# Patient Record
Sex: Male | Born: 1950
Health system: Southern US, Community
[De-identification: ages and names within clinical notes are randomized; demographics above are authoritative.]

## PROBLEM LIST (undated history)

## (undated) DIAGNOSIS — M2550 Pain in unspecified joint: Secondary | ICD-10-CM

## (undated) DIAGNOSIS — C801 Malignant (primary) neoplasm, unspecified: Secondary | ICD-10-CM

## (undated) DIAGNOSIS — E785 Hyperlipidemia, unspecified: Secondary | ICD-10-CM

## (undated) DIAGNOSIS — K5792 Diverticulitis of intestine, part unspecified, without perforation or abscess without bleeding: Secondary | ICD-10-CM

## (undated) DIAGNOSIS — I809 Phlebitis and thrombophlebitis of unspecified site: Secondary | ICD-10-CM

## (undated) DIAGNOSIS — K219 Gastro-esophageal reflux disease without esophagitis: Secondary | ICD-10-CM

## (undated) DIAGNOSIS — R112 Nausea with vomiting, unspecified: Secondary | ICD-10-CM

## (undated) DIAGNOSIS — K649 Unspecified hemorrhoids: Secondary | ICD-10-CM

## (undated) DIAGNOSIS — M1990 Unspecified osteoarthritis, unspecified site: Secondary | ICD-10-CM

## (undated) DIAGNOSIS — Z9889 Other specified postprocedural states: Secondary | ICD-10-CM

## (undated) DIAGNOSIS — M549 Dorsalgia, unspecified: Secondary | ICD-10-CM

## (undated) DIAGNOSIS — H9190 Unspecified hearing loss, unspecified ear: Secondary | ICD-10-CM

## (undated) DIAGNOSIS — N200 Calculus of kidney: Secondary | ICD-10-CM

## (undated) DIAGNOSIS — R972 Elevated prostate specific antigen [PSA]: Secondary | ICD-10-CM

## (undated) HISTORY — DX: Hyperlipidemia, unspecified: E78.5

## (undated) HISTORY — DX: Dorsalgia, unspecified: M54.9

## (undated) HISTORY — DX: Diverticulitis of intestine, part unspecified, without perforation or abscess without bleeding: K57.92

## (undated) HISTORY — DX: Gastro-esophageal reflux disease without esophagitis: K21.9

## (undated) HISTORY — DX: Elevated prostate specific antigen (PSA): R97.20

## (undated) HISTORY — PX: OTHER SURGICAL HISTORY: SHX169

## (undated) HISTORY — DX: Unspecified hemorrhoids: K64.9

## (undated) HISTORY — DX: Calculus of kidney: N20.0

---

## 2013-12-31 HISTORY — PX: BACK SURGERY: SHX140

## 2016-07-30 ENCOUNTER — Encounter: Payer: Self-pay | Admitting: Internal Medicine

## 2016-08-13 ENCOUNTER — Encounter: Payer: Self-pay | Admitting: Nurse Practitioner

## 2016-08-13 ENCOUNTER — Ambulatory Visit (INDEPENDENT_AMBULATORY_CARE_PROVIDER_SITE_OTHER): Payer: BLUE CROSS/BLUE SHIELD | Admitting: Nurse Practitioner

## 2016-08-13 DIAGNOSIS — K625 Hemorrhage of anus and rectum: Secondary | ICD-10-CM

## 2016-08-13 DIAGNOSIS — R197 Diarrhea, unspecified: Secondary | ICD-10-CM

## 2016-08-13 NOTE — Progress Notes (Signed)
cc'ed to pcp °

## 2016-08-13 NOTE — Patient Instructions (Signed)
1. Good luck with her prostate biopsy. 2. We will request your previous colonoscopy records from [facility]. 3. Call us when you're ready to move forward with the colonoscopy. 4. Return for follow-up in 8 weeks to check on your diarrhea.

## 2016-08-13 NOTE — Progress Notes (Signed)
Primary Care Physician:  Glenda Chroman, MD Primary Gastroenterologist:  Dr. Gala Romney  Chief Complaint  Patient presents with  . Blood In Stools    x6 weeks, improving. Started after being on Cipro for prostate  . Hemorrhoids  . Diarrhea    HPI:   Albert Graham is a 65 y.o. male who presents In referral from primary care for rectal bleeding. PCP notes reviewed. The patient last saw primary care on 07/23/2016 for recheck of diarrhea on antibiotics. At that time stated loose stools for 2 weeks prior to last PCP visit. Stool study for C. difficile was ordered, referral to GI for diarrhea and blood in stool. Prostate biopsy is planned by [facility]. Per PCP notes, last colonoscopy was in 2007. No record in our system of colonoscopy or endoscopy.  Today he states he's doing well overall. On 10/17 he went for his physical and immunizations, PSA was high and was placed on antibiotics which didn't impove PSA. Had new onset of diarrhea and gas with antibiotics. PCP switched his antibiotic. PSA further increased and is seeing urology. Was also recommended to take probiotics, Immodium, Pepto, and Yogurt; no improvement in diarrhea. States CDiff was negative. Began having rectal bleeding with diarrhea. Has a history of bleeding hemorrhoids. Blood is on the stool and toilet tissue. Was having hemorrhoids symptoms and protrusion about 3 weeks ago when improved after a few days. Denies abdominal pain, N/V, unintentional weight loss, melena. Denies chest pain, dyspnea, dizziness, lightheadedness, syncope, near syncope. Denies any other upper or lower GI symptoms.  Completed antibiotics about 1 week ago. States he had a bowel movement before his OV today which "was the first one in a while that was more normal."   Last colonoscopy 2007, done at [facility] in [place], [place]. History of benign polyps. Last colonoscopy was clear.   Past Medical History:  Diagnosis Date  . Back pain   . Dyslipidemia   . Hemorrhoids     . Kidney stone     No past surgical history on file.  Current Outpatient Prescriptions  Medication Sig Dispense Refill  . aspirin EC 81 MG tablet Take 81 mg by mouth 2 (two) times daily.    Marland Kitchen atorvastatin (LIPITOR) 10 MG tablet Take 10 mg by mouth daily.  0  . Co-Enzyme Q-10 100 MG CAPS Take 1 capsule by mouth daily.    . diclofenac sodium (VOLTAREN) 1 % GEL Apply topically as needed.    . Multiple Vitamin (MULTIVITAMIN) tablet Take 1 tablet by mouth daily.     No current facility-administered medications for this visit.     Allergies as of 08/13/2016 - Review Complete 08/13/2016  Allergen Reaction Noted  . Codeine Nausea And Vomiting 08/13/2016    Family History  Problem Relation Age of Onset  . Stomach cancer Father   . Colon cancer Neg Hx     Social History   Social History  . Marital status: Married    Spouse name: N/A  . Number of children: N/A  . Years of education: N/A   Occupational History  . Not on file.   Social History Main Topics  . Smoking status: Never Smoker  . Smokeless tobacco: Never Used  . Alcohol use No  . Drug use: No  . Sexual activity: Not on file   Other Topics Concern  . Not on file   Social History Narrative  . No narrative on file    Review of Systems: General: Negative for  anorexia, weight loss, fever, chills, fatigue, weakness. ENT: Negative for hoarseness, difficulty swallowing. CV: Negative for chest pain, angina, palpitations, peripheral edema.  Respiratory: Negative for dyspnea at rest, cough, sputum, wheezing.  GI: See history of present illness. MS: Negative for joint pain, low back pain.  Derm: Negative for rash or itching.  Endo: Negative for unusual weight change.  Heme: Negative for bruising or bleeding. Allergy: Negative for rash or hives.    Physical Exam: BP (!) 147/88   Pulse (!) 104   Temp 98 F (36.7 C) (Oral)   Ht 5\' 7"  (1.702 m)   Wt 186 lb (84.4 kg)   BMI 29.13 kg/m  General:   Alert and  oriented. Pleasant and cooperative. Well-nourished and well-developed.  Head:  Normocephalic and atraumatic. Eyes:  Without icterus, sclera clear and conjunctiva pink.  Ears:  Normal auditory acuity. Cardiovascular:  S1, S2 present without murmurs appreciated. Extremities without clubbing or edema. Respiratory:  Clear to auscultation bilaterally. No wheezes, rales, or rhonchi. No distress.  Gastrointestinal:  +BS, soft, non-tender and non-distended. No HSM noted. No guarding or rebound. No masses appreciated.  Rectal:  Deferred  Musculoskalatal:  Symmetrical without gross deformities. Neurologic:  Alert and oriented x4;  grossly normal neurologically. Psych:  Alert and cooperative. Normal mood and affect. Heme/Lymph/Immune: No excessive bruising noted.    08/13/2016 12:48 PM   Disclaimer: This note was dictated with voice recognition software. Similar sounding words can inadvertently be transcribed and may not be corrected upon review.

## 2016-08-13 NOTE — Assessment & Plan Note (Signed)
Patient has had new onset diarrhea since starting antibiotics. Is taking probiotic currently. Had a nearly formed stool today, has been off abx for about 1 week. CDiff negative. Noted rectal bleeding in the setting of known hemorrhoids as noted above. Likely antibiotic associated diarrhea, No GI path panel today due to stool now becoming formed. Retrn for follow-up in 8 weeks. Call if worsening/recurrent symptoms. He will notify us about wishes for repeat colonoscopy timeframe based on the results of his prostate biopsy.

## 2016-08-13 NOTE — Assessment & Plan Note (Signed)
The patient describes rectal bleeding, though not severe, in the setting of known hemorrhoids and diarrhea due to antibiotic use. His last colonoscopy was in 2007, we have requested records. At this point he is due for colonoscopy. However, he has a biopsy of his prostate coming up due to accelerating PSA. That is scheduled for Wednesday. He would like to hold off to see if he needs to undergo treatment for his prostate and will get back to [place] about scheduling colonoscopy.

## 2016-09-18 ENCOUNTER — Other Ambulatory Visit: Payer: Self-pay | Admitting: Urology

## 2016-10-12 ENCOUNTER — Other Ambulatory Visit (HOSPITAL_COMMUNITY): Payer: BLUE CROSS/BLUE SHIELD

## 2016-10-15 ENCOUNTER — Ambulatory Visit: Payer: BLUE CROSS/BLUE SHIELD | Admitting: Nurse Practitioner

## 2016-10-15 ENCOUNTER — Other Ambulatory Visit (HOSPITAL_COMMUNITY): Payer: Self-pay | Admitting: *Deleted

## 2016-10-15 NOTE — Patient Instructions (Signed)
Albert Graham  10/15/2016   Your procedure is scheduled on: 10-18-16  Report to [facility]  Entrance take [facility]  elevators to 3rd floor to  [place] at 515  AM.  Call this number if you have problems the morning of surgery [phone]   Remember: ONLY 1 PERSON MAY GO WITH YOU TO SHORT STAY TO GET  READY MORNING OF [place].  Do not eat food:After Midnight Tuesday NIGHT CLEAR LIQUIDS ALL DAY Wednesday 10-17-16 PER DR BORDEN, FOLLOW ALL BOWEL PEP INSTRUCTIONS FROM DR Alinda Money. NOTHING BY MOUTH AFTER MIDNIGHT Wednesday NIGHT. .     Take these medicines the morning of surgery with A SIP OF WATER: NONE                               You may not have any metal on your body including hair pins and              piercings  Do not wear jewelry, make-up, lotions, powders or perfumes, deodorant             Do not wear nail polish.  Do not shave  48 hours prior to surgery.              Men may shave face and neck.   Do not bring valuables to the hospital. [place].  Contacts, dentures or bridgework may not be worn into surgery.  Leave suitcase in the car. After surgery it may be brought to your room.                  Please read over the following fact sheets you were given: _____________________________________________________________________                CLEAR LIQUID DIET   Foods Allowed                                                                     Foods Excluded  Coffee and tea, regular and decaf                             liquids that you cannot  Plain Jell-O in any flavor                                             see through such as: Fruit ices (not with fruit pulp)                                     milk, soups, orange juice  Iced Popsicles  All solid food Carbonated beverages, regular and diet                                    Cranberry, grape and  apple juices Sports drinks like Gatorade Lightly seasoned clear broth or consume(fat free) Sugar, honey syrup  Sample Menu Breakfast                                Lunch                                     Supper Cranberry juice                    Beef broth                            Chicken broth Jell-O                                     Grape juice                           Apple juice Coffee or tea                        Jell-O                                      Popsicle                                                Coffee or tea                        Coffee or tea  _____________________________________________________________________  [facility] - Preparing for Surgery Before surgery, you can play an important role.  Because skin is not sterile, your skin needs to be as free of germs as possible.  You can reduce the number of germs on your skin by washing with CHG (chlorahexidine gluconate) soap before surgery.  CHG is an antiseptic cleaner which kills germs and bonds with the skin to continue killing germs even after washing. Please DO NOT use if you have an allergy to CHG or antibacterial soaps.  If your skin becomes reddened/irritated stop using the CHG and inform your nurse when you arrive at Short Stay. Do not shave (including legs and underarms) for at least 48 hours prior to the first CHG shower.  You may shave your face/neck. Please follow these instructions carefully:  1.  Shower with CHG Soap the night before surgery and the  morning of Surgery.  2.  If you choose to wash your hair, wash your hair first as usual with your  normal  shampoo.  3.  After you shampoo, rinse your hair and body thoroughly to remove the  shampoo.  4.  Use CHG as you would any other liquid soap.  You can apply chg directly  to the skin and wash                       Gently with a scrungie or clean washcloth.  5.  Apply the CHG Soap to your body ONLY FROM THE NECK DOWN.   Do  not use on face/ open                           Wound or open sores. Avoid contact with eyes, ears mouth and genitals (private parts).                       Wash face,  Genitals (private parts) with your normal soap.             6.  Wash thoroughly, paying special attention to the area where your surgery  will be performed.  7.  Thoroughly rinse your body with warm water from the neck down.  8.  DO NOT shower/wash with your normal soap after using and rinsing off  the CHG Soap.                9.  Pat yourself dry with a clean towel.            10.  Wear clean pajamas.            11.  Place clean sheets on your bed the night of your first shower and do not  sleep with pets. Day of Surgery : Do not apply any lotions/deodorants the morning of surgery.  Please wear clean clothes to the hospital/surgery center.  FAILURE TO FOLLOW THESE INSTRUCTIONS MAY RESULT IN THE CANCELLATION OF YOUR SURGERY PATIENT SIGNATURE_________________________________  NURSE SIGNATURE__________________________________  ________________________________________________________________________   Adam Phenix  An incentive spirometer is a tool that can help keep your lungs clear and active. This tool measures how well you are filling your lungs with each breath. Taking long deep breaths may help reverse or decrease the chance of developing breathing (pulmonary) problems (especially infection) following:  A long period of time when you are unable to move or be active. BEFORE THE PROCEDURE   If the spirometer includes an indicator to show your best effort, your nurse or respiratory therapist will set it to a desired goal.  If possible, sit up straight or lean slightly forward. Try not to slouch.  Hold the incentive spirometer in an upright position. INSTRUCTIONS FOR USE  1. Sit on the edge of your bed if possible, or sit up as far as you can in bed or on a chair. 2. Hold the incentive spirometer in an upright  position. 3. Breathe out normally. 4. Place the mouthpiece in your mouth and seal your lips tightly around it. 5. Breathe in slowly and as deeply as possible, raising the piston or the ball toward the top of the column. 6. Hold your breath for 3-5 seconds or for as long as possible. Allow the piston or ball to fall to the bottom of the column. 7. Remove the mouthpiece from your mouth and breathe out normally. 8. Rest for a few seconds and repeat Steps 1 through 7 at least 10 times every 1-2 hours when you are awake. Take your time and take a few normal breaths between deep breaths. 9. The spirometer may include an indicator to  show your best effort. Use the indicator as a goal to work toward during each repetition. 10. After each set of 10 deep breaths, practice coughing to be sure your lungs are clear. If you have an incision (the cut made at the time of surgery), support your incision when coughing by placing a pillow or rolled up towels firmly against it. Once you are able to get out of bed, walk around indoors and cough well. You may stop using the incentive spirometer when instructed by your caregiver.  RISKS AND COMPLICATIONS  Take your time so you do not get dizzy or light-headed.  If you are in pain, you may need to take or ask for pain medication before doing incentive spirometry. It is harder to take a deep breath if you are having pain. AFTER USE  Rest and breathe slowly and easily.  It can be helpful to keep track of a log of your progress. Your caregiver can provide you with a simple table to help with this. If you are using the spirometer at home, follow these instructions: [place] IF:   You are having difficultly using the spirometer.  You have trouble using the spirometer as often as instructed.  Your pain medication is not giving enough relief while using the spirometer.  You develop fever of 100.5 F (38.1 C) or higher. SEEK IMMEDIATE MEDICAL CARE IF:    You cough up bloody sputum that had not been present before.  You develop fever of 102 F (38.9 C) or greater.  You develop worsening pain at or near the incision site. MAKE SURE YOU:   Understand these instructions.  Will watch your condition.  Will get help right away if you are not doing well or get worse. Document Released: 01/07/2007 Document Revised: 11/19/2011 Document Reviewed: 03/10/2007 ExitCare Patient Information 2014 [employer], [place].   ________________________________________________________________________  WHAT IS A BLOOD TRANSFUSION? Blood Transfusion Information  A transfusion is the replacement of blood or some of its parts. Blood is made up of multiple cells which provide different functions.  Red blood cells carry oxygen and are used for blood loss replacement.  White blood cells fight against infection.  Platelets control bleeding.  Plasma helps clot blood.  Other blood products are available for specialized needs, such as hemophilia or other clotting disorders. BEFORE THE TRANSFUSION  Who gives blood for transfusions?   Healthy volunteers who are fully evaluated to make sure their blood is safe. This is blood bank blood. Transfusion therapy is the safest it has ever been in the practice of medicine. Before blood is taken from a donor, a complete history is taken to make sure that person has no history of diseases nor engages in risky social behavior (examples are intravenous drug use or sexual activity with multiple partners). The donor's travel history is screened to minimize risk of transmitting infections, such as malaria. The donated blood is tested for signs of infectious diseases, such as HIV and hepatitis. The blood is then tested to be sure it is compatible with you in order to minimize the chance of a transfusion reaction. If you or a relative donates blood, this is often done in anticipation of surgery and is not appropriate for emergency  situations. It takes many days to process the donated blood. RISKS AND COMPLICATIONS Although transfusion therapy is very safe and saves many lives, the main dangers of transfusion include:   Getting an infectious disease.  Developing a transfusion reaction. This is an allergic reaction  to something in the blood you were given. Every precaution is taken to prevent this. The decision to have a blood transfusion has been considered carefully by your caregiver before blood is given. Blood is not given unless the benefits outweigh the risks. AFTER THE TRANSFUSION  Right after receiving a blood transfusion, you will usually feel much better and more energetic. This is especially true if your red blood cells have gotten low (anemic). The transfusion raises the level of the red blood cells which carry oxygen, and this usually causes an energy increase.  The nurse administering the transfusion will monitor you carefully for complications. HOME CARE INSTRUCTIONS  No special instructions are needed after a transfusion. You may find your energy is better. Speak with your caregiver about any limitations on activity for underlying diseases you may have. SEEK MEDICAL CARE IF:   Your condition is not improving after your transfusion.  You develop redness or irritation at the intravenous (IV) site. SEEK IMMEDIATE MEDICAL CARE IF:  Any of the following symptoms occur over the next 12 hours:  Shaking chills.  You have a temperature by mouth above 102 F (38.9 C), not controlled by medicine.  Chest, back, or muscle pain.  People around you feel you are not acting correctly or are confused.  Shortness of breath or difficulty breathing.  Dizziness and fainting.  You get a rash or develop hives.  You have a decrease in urine output.  Your urine turns a dark color or changes to pink, red, or brown. Any of the following symptoms occur over the next 10 days:  You have a temperature by mouth above  102 F (38.9 C), not controlled by medicine.  Shortness of breath.  Weakness after normal activity.  The white part of the eye turns yellow (jaundice).  You have a decrease in the amount of urine or are urinating less often.  Your urine turns a dark color or changes to pink, red, or brown. Document Released: 08/24/2000 Document Revised: 11/19/2011 Document Reviewed: 04/12/2008 [facility] Patient Information 2014 [place], [place].  _______________________________________________________________________

## 2016-10-16 ENCOUNTER — Encounter (HOSPITAL_COMMUNITY)
Admission: RE | Admit: 2016-10-16 | Discharge: 2016-10-16 | Disposition: A | Discharge status: Home / Self Care | Payer: BLUE CROSS/BLUE SHIELD | Source: Ambulatory Visit | Attending: Urology | Admitting: Urology

## 2016-10-16 ENCOUNTER — Encounter (INDEPENDENT_AMBULATORY_CARE_PROVIDER_SITE_OTHER): Payer: Self-pay

## 2016-10-16 ENCOUNTER — Encounter (HOSPITAL_COMMUNITY): Payer: Self-pay

## 2016-10-16 ENCOUNTER — Ambulatory Visit (HOSPITAL_COMMUNITY)
Admission: RE | Admit: 2016-10-16 | Discharge: 2016-10-16 | Disposition: A | Discharge status: Home / Self Care | Payer: BLUE CROSS/BLUE SHIELD | Source: Ambulatory Visit | Attending: Urology | Admitting: Urology

## 2016-10-16 DIAGNOSIS — Z01818 Encounter for other preprocedural examination: Secondary | ICD-10-CM

## 2016-10-16 DIAGNOSIS — Z01810 Encounter for preprocedural cardiovascular examination: Secondary | ICD-10-CM | POA: Diagnosis not present

## 2016-10-16 DIAGNOSIS — C61 Malignant neoplasm of prostate: Secondary | ICD-10-CM | POA: Insufficient documentation

## 2016-10-16 DIAGNOSIS — Z01812 Encounter for preprocedural laboratory examination: Secondary | ICD-10-CM | POA: Diagnosis not present

## 2016-10-16 HISTORY — DX: Unspecified hearing loss, unspecified ear: H91.90

## 2016-10-16 HISTORY — DX: Phlebitis and thrombophlebitis of unspecified site: I80.9

## 2016-10-16 HISTORY — DX: Unspecified osteoarthritis, unspecified site: M19.90

## 2016-10-16 HISTORY — DX: Pain in unspecified joint: M25.50

## 2016-10-16 HISTORY — DX: Nausea with vomiting, unspecified: R11.2

## 2016-10-16 HISTORY — DX: Malignant (primary) neoplasm, unspecified: C80.1

## 2016-10-16 HISTORY — DX: Other specified postprocedural states: Z98.890

## 2016-10-16 LAB — BASIC METABOLIC PANEL
ANION GAP: 9 (ref 5–15)
BUN: 14 mg/dL (ref 6–20)
CHLORIDE: 104 mmol/L (ref 101–111)
CO2: 25 mmol/L (ref 22–32)
Calcium: 9.6 mg/dL (ref 8.9–10.3)
Creatinine, Ser: 0.95 mg/dL (ref 0.61–1.24)
GFR calc Af Amer: >60 mL/min (ref >60)
GLUCOSE: 109 mg/dL — AB (ref 65–99)
Potassium: 3.8 mmol/L (ref 3.5–5.1)
Sodium: 138 mmol/L (ref 135–145)

## 2016-10-16 LAB — CBC
HEMATOCRIT: 46.1 % (ref 39.0–52.0)
HEMOGLOBIN: 15.8 g/dL (ref 13.0–17.0)
MCH: 29.1 pg (ref 26.0–34.0)
MCHC: 34.3 g/dL (ref 30.0–36.0)
MCV: 84.9 fL (ref 78.0–100.0)
Platelets: 201 10*3/uL (ref 150–400)
RBC: 5.43 MIL/uL (ref 4.22–5.81)
RDW: 13.1 % (ref 11.5–15.5)
WBC: 8.1 10*3/uL (ref 4.0–10.5)

## 2016-10-16 LAB — ABO/RH: ABO/RH(D): A POS

## 2016-10-17 NOTE — H&P (Signed)
Office Visit Report     10/12/2016   --------------------------------------------------------------------------------   Albert Graham  MRN: [id number]  PRIMARY CARE:  Glenda Chroman, MD  DOB: [birth year], 66 year old Male  REFERRING:  Glenda Chroman, MD  SSN: [id number]  PROVIDER:  Irine Seal, M.D.    TREATING:  Raynelle Bring, M.D.    LOCATION:  [facility] [phone]   --------------------------------------------------------------------------------   CC/HPI: CC: Prostate Cancer   Physician requesting consult: Dr. Irine Seal  PCP: Dr. Woody Seller   Albert Graham is a 66 year old gentleman noted to have a PSA of 5.9 and right base prostate nodule that prompted a TRUS biopsy of the prostate by Dr. Jeffie Pollock on 08/15/16. This confirmed Gleason 3+4=7 adenocarcinoma with 6 out of 12 biopsy cores positive for malignancy.   Family history: None.   Imaging studies: None.   PMH: He has a history of hypercholesterolemia. He also has a remote history of a DVT (lower extremity and another upper extremity episode) and remains on aspirin 162 mg for this reason.  PSH: No abdominal surgeries.   TNM stage: cT2a Nx Mx (right base nodule)  PSA: 5.9  Gleason score: 3+4=7  Biopsy (08/15/16): 6/12 cores positive  Left: L lateral apex (20%, 3+3=6), L apex (20%, 3+3=6), L lateral mid (30%, 3+3=6), L mid (10%, 3+3=6)  Right: R base (10%, 3+3=6), R lateral base (70%, 3+4=7, PNI)  Prostate volume: 54 cc   Nomogram  OC disease: 36%  EPE: 63%  SVI: 6%  LNI: 6%  PFS (5 year, 10 year): 82%,70%   Urinary function: IPSS is 0.  Erectile function: SHIM score is 5. He maintains excellent function but is not sexually active. His wife did have vaginal prolapse surgery and is no longer able to have intercourse without discomfort. This is a low priority to him and his wife.     ALLERGIES: Codeine    MEDICATIONS: Aspirin 81 mg tablet, chewable  Atorvastatin Calcium  Coq10  Diclofenac Sodium 75 mg tablet,  delayed release  Multivitamin  Tramadol Hcl     GU PSH: Prostate Needle Biopsy - 08/15/2016    NON-GU PSH: Lumbar Laminectomy Surgical Pathology, Gross And Microscopic Examination For Prostate Needle - 08/15/2016    GU PMH: Prostate Cancer, He has T2a Nx Mx Gleason 7(3+4) prostate cancer with minimal LUTs and preserve erections. After a review of the options he would like to proceed with surgical therapy with Dr. Alinda Money so I will arrange a consultation. - 09/12/2016 Nodular prostate w/o LUTS - 07/17/2016 Kidney Stone    NON-GU PMH: Muscle weakness (generalized) - 10/03/2016, - 09/25/2016 Arthritis DVT, History Hypercholesterolemia    FAMILY HISTORY: 1 son - Other Cancer - Mother, Father   SOCIAL HISTORY: Marital Status: Married Current Smoking Status: Patient has never smoked.  Does not drink caffeine.     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    REVIEW OF SYSTEMS:    GU Review Male:   Patient denies frequent urination, hard to postpone urination, burning/ pain with urination, get up at night to urinate, leakage of urine, stream starts and stops, trouble starting your streams, and have to strain to urinate .  Gastrointestinal (Upper):   Patient denies nausea and vomiting.  Gastrointestinal (Lower):   Patient denies diarrhea and constipation.  Constitutional:   Patient denies fever, night sweats, weight loss, and fatigue.  Skin:   Patient denies skin rash/ lesion and itching.  Eyes:  Patient denies blurred vision and double vision.  Ears/ Nose/ Throat:   Patient denies sore throat and sinus problems.  Hematologic/Lymphatic:   Patient denies swollen glands and easy bruising.  Cardiovascular:   Patient denies leg swelling and chest pains.  Respiratory:   Patient denies cough and shortness of breath.  Endocrine:   Patient denies excessive thirst.  Musculoskeletal:   Patient denies back pain and joint pain.  Neurological:   Patient denies headaches and  dizziness.  Psychologic:   Patient denies depression and anxiety.   VITAL SIGNS:      10/12/2016 11:45 AM  Weight 192 lb / 87.09 kg  Height 67 in / 170.18 cm  BP 119/81 mmHg  Pulse 92 /min  BMI 30.1 kg/m   MULTI-SYSTEM PHYSICAL EXAMINATION:    Constitutional: Well-nourished. No physical deformities. Normally developed. Good grooming.  Neck: Neck symmetrical, not swollen. Normal tracheal position.  Respiratory: No labored breathing, no use of accessory muscles. Clear bilaterally.  Cardiovascular: Normal temperature, normal extremity pulses, no swelling, no varicosities. RRR.  Lymphatic: No enlargement of neck, axillae, groin.  Skin: No paleness, no jaundice, no cyanosis. No lesion, no ulcer, no rash.  Neurologic / Psychiatric: Oriented to time, oriented to place, oriented to person. No depression, no anxiety, no agitation.  Gastrointestinal: No mass, no tenderness, no rigidity, non obese abdomen.  Eyes: Normal conjunctivae. Normal eyelids.  Ears, Nose, Mouth, and Throat: Left ear no scars, no lesions, no masses. Right ear no scars, no lesions, no masses. Nose no scars, no lesions, no masses. Normal hearing. Normal lips.  Musculoskeletal: Normal gait and station of head and neck.     PAST DATA REVIEWED:  Source Of History:  Patient  Lab Test Review:   PSA  Records Review:   Pathology Reports, Previous Patient Records  Urine Test Review:   Urinalysis   07/13/16 06/27/16 06/09/15  PSA  Total PSA 5.9 ng/dl 4.9 ng/dl 3.8 ng/dl  % Free PSA 15.1 %      PROCEDURES:          Urinalysis Dipstick Dipstick Cont'd  Color: Yellow Bilirubin: Neg  Appearance: Clear Ketones: Neg  Specific Gravity: 1.015 Blood: Neg  pH: 7.0 Protein: Neg  Glucose: Neg Urobilinogen: 0.2    Nitrites: Neg    Leukocyte Esterase: Neg    ASSESSMENT:      ICD-10 Details  1 GU:   Prostate Cancer - C61    PLAN:           Schedule Return Visit/Planned Activity: Keep Scheduled Appointment           Document Letter(s):  Created for Patient: Clinical Summary         Notes:   1. Prostate cancer: I had a detailed discussion with Albert Graham and his wife today. He has elected to proceed with surgical treatment of his prostate cancer.   The patient was counseled about the natural history of prostate cancer and the standard treatment options that are available for prostate cancer. It was explained to him how his age and life expectancy, clinical stage, Gleason score, and PSA affect his prognosis, the decision to proceed with additional staging studies, as well as how that information influences recommended treatment strategies. We discussed the roles for active surveillance, radiation therapy, surgical therapy, androgen deprivation, as well as ablative therapy options for the treatment of prostate cancer as appropriate to his individual cancer situation. We discussed the risks and benefits of these options with regard to their impact  on cancer control and also in terms of potential adverse events, complications, and impact on quality of life particularly related to urinary and sexual function. The patient was encouraged to ask questions throughout the discussion today and all questions were answered to his stated satisfaction. In addition, the patient was provided with and/or directed to appropriate resources and literature for further education about prostate cancer and treatment options.   We discussed surgical therapy for prostate cancer including the different available surgical approaches. We discussed, in detail, the risks and expectations of surgery with regard to cancer control, urinary control, and erectile function as well as the expected postoperative recovery process. Additional risks of surgery including but not limited to bleeding, infection, hernia formation, nerve damage, lymphocele formation, bowel/rectal injury potentially necessitating colostomy, damage to the urinary tract resulting in urine  leakage, urethral stricture, and the cardiopulmonary risks such as myocardial infarction, stroke, death, venothromboembolism, etc. were explained. The risk of open surgical conversion for robotic/laparoscopic prostatectomy was also discussed.   He is scheduled undergo a bilateral nerve sparing robot-assisted laparoscopic radical prostatectomy and pelvic lymphadenectomy next week.   I will plan to give him 5000 units of subcutaneous heparin preoperatively and perioperatively. He also has been instructed that he can remain on aspirin 81 mg perioperatively.   Cc: Dr. Irine Seal  Dr. Vonita Moss & M CODE: I spent at least 60 minutes face to face with the patient, more than 50% of that time was spent on counseling and/or coordinating care.     * Signed by Raynelle Bring, M.D. on 10/13/16 at 7:53 AM (EST)*

## 2016-10-18 ENCOUNTER — Encounter (HOSPITAL_COMMUNITY): Payer: Self-pay | Admitting: *Deleted

## 2016-10-18 ENCOUNTER — Inpatient Hospital Stay (HOSPITAL_COMMUNITY): Payer: BLUE CROSS/BLUE SHIELD | Admitting: Certified Registered Nurse Anesthetist

## 2016-10-18 ENCOUNTER — Observation Stay (HOSPITAL_COMMUNITY)
Admission: RE | Admit: 2016-10-18 | Discharge: 2016-10-19 | DRG: 708 | Disposition: A | Discharge status: Home / Self Care | Payer: BLUE CROSS/BLUE SHIELD | Source: Ambulatory Visit | Attending: Urology | Admitting: Urology

## 2016-10-18 ENCOUNTER — Encounter (HOSPITAL_COMMUNITY)
Admission: RE | Disposition: A | Discharge status: Home / Self Care | Payer: Self-pay | Source: Ambulatory Visit | Attending: Urology

## 2016-10-18 DIAGNOSIS — E7800 Pure hypercholesterolemia, unspecified: Secondary | ICD-10-CM | POA: Diagnosis not present

## 2016-10-18 DIAGNOSIS — Z86718 Personal history of other venous thrombosis and embolism: Secondary | ICD-10-CM | POA: Diagnosis not present

## 2016-10-18 DIAGNOSIS — C61 Malignant neoplasm of prostate: Secondary | ICD-10-CM | POA: Diagnosis present

## 2016-10-18 DIAGNOSIS — Z79899 Other long term (current) drug therapy: Secondary | ICD-10-CM | POA: Insufficient documentation

## 2016-10-18 DIAGNOSIS — Z7982 Long term (current) use of aspirin: Secondary | ICD-10-CM | POA: Insufficient documentation

## 2016-10-18 HISTORY — PX: LYMPHADENECTOMY: SHX5960

## 2016-10-18 HISTORY — PX: ROBOT ASSISTED LAPAROSCOPIC RADICAL PROSTATECTOMY: SHX5141

## 2016-10-18 LAB — HEMOGLOBIN AND HEMATOCRIT, BLOOD
HEMATOCRIT: 41.3 % (ref 39.0–52.0)
HEMOGLOBIN: 14 g/dL (ref 13.0–17.0)

## 2016-10-18 LAB — TYPE AND SCREEN
ABO/RH(D): A POS
ANTIBODY SCREEN: NEGATIVE

## 2016-10-18 SURGERY — XI ROBOTIC ASSISTED LAPAROSCOPIC RADICAL PROSTATECTOMY LEVEL 2
Anesthesia: General

## 2016-10-18 MED ORDER — FENTANYL CITRATE (PF) 100 MCG/2ML IJ SOLN
INTRAMUSCULAR | Status: DC | PRN
  Administered 2016-10-18 (×6): 50 ug via INTRAVENOUS

## 2016-10-18 MED ORDER — SCOPOLAMINE 1 MG/3DAYS TD PT72
MEDICATED_PATCH | TRANSDERMAL | Status: DC | PRN
  Administered 2016-10-18: 1 via TRANSDERMAL

## 2016-10-18 MED ORDER — ONDANSETRON HCL 4 MG/2ML IJ SOLN: 4.0000 mg | Freq: Once | INTRAMUSCULAR | Status: DC | PRN

## 2016-10-18 MED ORDER — PROPOFOL 10 MG/ML IV BOLUS
INTRAVENOUS | Status: AC
  Filled 2016-10-18: qty 20

## 2016-10-18 MED ORDER — CEFAZOLIN IN D5W 1 GM/50ML IV SOLN
1.0000 g | Freq: Three times a day (TID) | INTRAVENOUS | Status: AC
  Administered 2016-10-18 – 2016-10-19 (×2): 1 g via INTRAVENOUS
  Filled 2016-10-18 (×4): qty 50

## 2016-10-18 MED ORDER — CEFAZOLIN SODIUM-DEXTROSE 2-4 GM/100ML-% IV SOLN
2.0000 g | INTRAVENOUS | Status: AC
  Administered 2016-10-18: 2 g via INTRAVENOUS

## 2016-10-18 MED ORDER — DIPHENHYDRAMINE HCL 12.5 MG/5ML PO ELIX
12.5000 mg | ORAL_SOLUTION | Freq: Four times a day (QID) | ORAL | Status: DC | PRN
  Filled 2016-10-18: qty 10

## 2016-10-18 MED ORDER — ROCURONIUM BROMIDE 50 MG/5ML IV SOSY
PREFILLED_SYRINGE | INTRAVENOUS | Status: AC
  Filled 2016-10-18: qty 5

## 2016-10-18 MED ORDER — HEPARIN SODIUM (PORCINE) 5000 UNIT/ML IJ SOLN
INTRAMUSCULAR | Status: AC
  Filled 2016-10-18: qty 1

## 2016-10-18 MED ORDER — LIDOCAINE 2% (20 MG/ML) 5 ML SYRINGE
INTRAMUSCULAR | Status: DC | PRN
  Administered 2016-10-18: 100 mg via INTRAVENOUS

## 2016-10-18 MED ORDER — BUPIVACAINE HCL (PF) 0.25 % IJ SOLN
INTRAMUSCULAR | Status: AC
  Filled 2016-10-18: qty 30

## 2016-10-18 MED ORDER — CEFAZOLIN IN D5W 1 GM/50ML IV SOLN
INTRAVENOUS | Status: AC
Start: 2016-10-18 — End: 2016-10-18
  Filled 2016-10-18: qty 50

## 2016-10-18 MED ORDER — METOCLOPRAMIDE HCL 5 MG/ML IJ SOLN
INTRAMUSCULAR | Status: AC
  Filled 2016-10-18: qty 2

## 2016-10-18 MED ORDER — BUPIVACAINE HCL (PF) 0.25 % IJ SOLN
INTRAMUSCULAR | Status: DC | PRN
  Administered 2016-10-18: 30 mL

## 2016-10-18 MED ORDER — SODIUM CHLORIDE 0.9 % IJ SOLN
INTRAMUSCULAR | Status: AC
  Filled 2016-10-18: qty 10

## 2016-10-18 MED ORDER — LACTATED RINGERS IV SOLN: INTRAVENOUS | Status: DC

## 2016-10-18 MED ORDER — FENTANYL CITRATE (PF) 100 MCG/2ML IJ SOLN
25.0000 ug | INTRAMUSCULAR | Status: DC | PRN
Start: 2016-10-18 — End: 2016-10-18
  Administered 2016-10-18 (×2): 50 ug via INTRAVENOUS

## 2016-10-18 MED ORDER — DIPHENHYDRAMINE HCL 50 MG/ML IJ SOLN
INTRAMUSCULAR | Status: AC
Start: 2016-10-18 — End: 2016-10-18
  Filled 2016-10-18: qty 1

## 2016-10-18 MED ORDER — ONDANSETRON HCL 4 MG/2ML IJ SOLN
INTRAMUSCULAR | Status: DC | PRN
  Administered 2016-10-18: 4 mg via INTRAVENOUS

## 2016-10-18 MED ORDER — LACTATED RINGERS IV SOLN
INTRAVENOUS | Status: DC | PRN
  Administered 2016-10-18: 1000 mL

## 2016-10-18 MED ORDER — SCOPOLAMINE 1 MG/3DAYS TD PT72
MEDICATED_PATCH | TRANSDERMAL | Status: AC
  Filled 2016-10-18: qty 1

## 2016-10-18 MED ORDER — KETOROLAC TROMETHAMINE 15 MG/ML IJ SOLN: 15.0000 mg | Freq: Four times a day (QID) | INTRAMUSCULAR | Status: DC

## 2016-10-18 MED ORDER — ROCURONIUM BROMIDE 10 MG/ML (PF) SYRINGE
PREFILLED_SYRINGE | INTRAVENOUS | Status: DC | PRN
  Administered 2016-10-18 (×5): 10 mg via INTRAVENOUS
  Administered 2016-10-18: 50 mg via INTRAVENOUS

## 2016-10-18 MED ORDER — SENNA 8.6 MG PO TABS: 1.0000 | ORAL_TABLET | Freq: Two times a day (BID) | ORAL | Status: DC

## 2016-10-18 MED ORDER — FENTANYL CITRATE (PF) 100 MCG/2ML IJ SOLN
INTRAMUSCULAR | Status: AC
  Filled 2016-10-18: qty 2

## 2016-10-18 MED ORDER — HEPARIN SODIUM (PORCINE) 5000 UNIT/ML IJ SOLN
5000.0000 [IU] | Freq: Three times a day (TID) | INTRAMUSCULAR | Status: DC
  Filled 2016-10-18 (×5): qty 1

## 2016-10-18 MED ORDER — ONDANSETRON HCL 4 MG/2ML IJ SOLN: 4.0000 mg | INTRAMUSCULAR | Status: DC | PRN

## 2016-10-18 MED ORDER — PROPOFOL 10 MG/ML IV BOLUS
INTRAVENOUS | Status: DC | PRN
  Administered 2016-10-18: 150 mg via INTRAVENOUS

## 2016-10-18 MED ORDER — SUGAMMADEX SODIUM 200 MG/2ML IV SOLN
INTRAVENOUS | Status: AC
  Filled 2016-10-18: qty 2

## 2016-10-18 MED ORDER — ATORVASTATIN CALCIUM 10 MG PO TABS
5.0000 mg | ORAL_TABLET | Freq: Every day | ORAL | Status: DC
  Administered 2016-10-18: 5 mg via ORAL
  Filled 2016-10-18: qty 1
  Filled 2016-10-18 (×2): qty 0.5

## 2016-10-18 MED ORDER — DOCUSATE SODIUM 100 MG PO CAPS: 100.0000 mg | ORAL_CAPSULE | Freq: Two times a day (BID) | ORAL | Status: DC

## 2016-10-18 MED ORDER — MORPHINE SULFATE (PF) 10 MG/ML IV SOLN: 2.0000 mg | INTRAVENOUS | Status: DC | PRN

## 2016-10-18 MED ORDER — KCL IN DEXTROSE-NACL 20-5-0.45 MEQ/L-%-% IV SOLN: INTRAVENOUS | Status: DC

## 2016-10-18 MED ORDER — LACTATED RINGERS IV SOLN
INTRAVENOUS | Status: DC | PRN
  Administered 2016-10-18 (×2): via INTRAVENOUS

## 2016-10-18 MED ORDER — METOCLOPRAMIDE HCL 5 MG/ML IJ SOLN
INTRAMUSCULAR | Status: DC | PRN
  Administered 2016-10-18: 10 mg via INTRAVENOUS

## 2016-10-18 MED ORDER — KCL IN DEXTROSE-NACL 20-5-0.45 MEQ/L-%-% IV SOLN
INTRAVENOUS | Status: DC
  Administered 2016-10-18 – 2016-10-19 (×4): via INTRAVENOUS
  Filled 2016-10-18 (×3): qty 1000

## 2016-10-18 MED ORDER — LIDOCAINE 2% (20 MG/ML) 5 ML SYRINGE
INTRAMUSCULAR | Status: AC
  Filled 2016-10-18: qty 5

## 2016-10-18 MED ORDER — ONDANSETRON HCL 4 MG/2ML IJ SOLN
INTRAMUSCULAR | Status: AC
  Filled 2016-10-18: qty 2

## 2016-10-18 MED ORDER — DIPHENHYDRAMINE HCL 50 MG/ML IJ SOLN: 12.5000 mg | Freq: Four times a day (QID) | INTRAMUSCULAR | Status: DC | PRN

## 2016-10-18 MED ORDER — MORPHINE SULFATE (PF) 10 MG/ML IV SOLN
2.0000 mg | INTRAVENOUS | Status: DC | PRN
Start: 2016-10-18 — End: 2016-10-19
  Administered 2016-10-18: 2 mg via INTRAVENOUS
  Filled 2016-10-18: qty 1

## 2016-10-18 MED ORDER — SUGAMMADEX SODIUM 200 MG/2ML IV SOLN
INTRAVENOUS | Status: DC | PRN
  Administered 2016-10-18: 200 mg via INTRAVENOUS

## 2016-10-18 MED ORDER — KETOROLAC TROMETHAMINE 30 MG/ML IJ SOLN
15.0000 mg | Freq: Once | INTRAMUSCULAR | Status: AC
Start: 2016-10-18 — End: 2016-10-18
  Administered 2016-10-18: 15 mg via INTRAVENOUS

## 2016-10-18 MED ORDER — KCL IN DEXTROSE-NACL 20-5-0.45 MEQ/L-%-% IV SOLN
INTRAVENOUS | Status: AC
  Filled 2016-10-18: qty 1000

## 2016-10-18 MED ORDER — FENTANYL CITRATE (PF) 250 MCG/5ML IJ SOLN
INTRAMUSCULAR | Status: AC
  Filled 2016-10-18: qty 5

## 2016-10-18 MED ORDER — SULFAMETHOXAZOLE-TRIMETHOPRIM 800-160 MG PO TABS: 1.0000 | ORAL_TABLET | Freq: Two times a day (BID) | ORAL | 0 refills | Status: DC

## 2016-10-18 MED ORDER — HYDROCODONE-ACETAMINOPHEN 5-325 MG PO TABS: 1.0000 | ORAL_TABLET | Freq: Four times a day (QID) | ORAL | 0 refills | Status: DC | PRN

## 2016-10-18 MED ORDER — PHENYLEPHRINE 40 MCG/ML (10ML) SYRINGE FOR IV PUSH (FOR BLOOD PRESSURE SUPPORT)
PREFILLED_SYRINGE | INTRAVENOUS | Status: AC
  Filled 2016-10-18: qty 10

## 2016-10-18 MED ORDER — HEPARIN SODIUM (PORCINE) 5000 UNIT/ML IJ SOLN
INTRAMUSCULAR | Status: DC | PRN
  Administered 2016-10-18: 5000 [IU] via SUBCUTANEOUS

## 2016-10-18 MED ORDER — ASPIRIN EC 81 MG PO TBEC: 81.0000 mg | DELAYED_RELEASE_TABLET | Freq: Two times a day (BID) | ORAL | Status: DC

## 2016-10-18 MED ORDER — DEXAMETHASONE SODIUM PHOSPHATE 10 MG/ML IJ SOLN
INTRAMUSCULAR | Status: AC
  Filled 2016-10-18: qty 1

## 2016-10-18 MED ORDER — ACETAMINOPHEN 500 MG PO TABS: 1000.0000 mg | ORAL_TABLET | Freq: Four times a day (QID) | ORAL | Status: DC

## 2016-10-18 MED ORDER — HEPARIN SODIUM (PORCINE) 5000 UNIT/ML IJ SOLN
5000.0000 [IU] | Freq: Two times a day (BID) | INTRAMUSCULAR | Status: DC
  Administered 2016-10-18 – 2016-10-19 (×2): 5000 [IU] via SUBCUTANEOUS
  Filled 2016-10-18 (×5): qty 1

## 2016-10-18 MED ORDER — DEXAMETHASONE SODIUM PHOSPHATE 10 MG/ML IJ SOLN
INTRAMUSCULAR | Status: DC | PRN
  Administered 2016-10-18: 10 mg via INTRAVENOUS

## 2016-10-18 MED ORDER — SODIUM CHLORIDE 0.9 % IV BOLUS (SEPSIS)
1000.0000 mL | Freq: Once | INTRAVENOUS | Status: AC
  Administered 2016-10-18: 1000 mL via INTRAVENOUS

## 2016-10-18 MED ORDER — PHENYLEPHRINE 40 MCG/ML (10ML) SYRINGE FOR IV PUSH (FOR BLOOD PRESSURE SUPPORT)
PREFILLED_SYRINGE | INTRAVENOUS | Status: DC | PRN
  Administered 2016-10-18 (×2): 40 ug via INTRAVENOUS

## 2016-10-18 MED ORDER — ARTIFICIAL TEARS OP OINT
TOPICAL_OINTMENT | OPHTHALMIC | Status: DC | PRN
  Administered 2016-10-18: 1 via OPHTHALMIC

## 2016-10-18 MED ORDER — HEPARIN SODIUM (PORCINE) 1000 UNIT/ML IJ SOLN
INTRAMUSCULAR | Status: AC
  Filled 2016-10-18: qty 1

## 2016-10-18 MED ORDER — MIDAZOLAM HCL 2 MG/2ML IJ SOLN
INTRAMUSCULAR | Status: AC
  Filled 2016-10-18: qty 2

## 2016-10-18 MED ORDER — SODIUM CHLORIDE 0.9 % IR SOLN
Status: DC | PRN
  Administered 2016-10-18: 1000 mL via INTRAVESICAL

## 2016-10-18 MED ORDER — HEPARIN SODIUM (PORCINE) 5000 UNIT/ML IJ SOLN
5000.0000 [IU] | Freq: Three times a day (TID) | INTRAMUSCULAR | Status: DC
  Filled 2016-10-18 (×6): qty 1

## 2016-10-18 MED ORDER — FENTANYL CITRATE (PF) 100 MCG/2ML IJ SOLN: 25.0000 ug | INTRAMUSCULAR | Status: DC | PRN

## 2016-10-18 MED ORDER — ACETAMINOPHEN 325 MG PO TABS
650.0000 mg | ORAL_TABLET | ORAL | Status: DC | PRN
Start: 2016-10-18 — End: 2016-10-19

## 2016-10-18 MED ORDER — KETOROLAC TROMETHAMINE 15 MG/ML IJ SOLN
15.0000 mg | Freq: Four times a day (QID) | INTRAMUSCULAR | Status: DC
  Administered 2016-10-18 – 2016-10-19 (×3): 15 mg via INTRAVENOUS
  Filled 2016-10-18 (×3): qty 1

## 2016-10-18 MED ORDER — KETOROLAC TROMETHAMINE 30 MG/ML IJ SOLN
INTRAMUSCULAR | Status: AC
  Filled 2016-10-18: qty 1

## 2016-10-18 MED ORDER — DOCUSATE SODIUM 100 MG PO CAPS
100.0000 mg | ORAL_CAPSULE | Freq: Two times a day (BID) | ORAL | Status: DC
  Administered 2016-10-18 – 2016-10-19 (×3): 100 mg via ORAL
  Filled 2016-10-18 (×7): qty 1

## 2016-10-18 MED ORDER — SUCCINYLCHOLINE CHLORIDE 200 MG/10ML IV SOSY
PREFILLED_SYRINGE | INTRAVENOUS | Status: AC
  Filled 2016-10-18: qty 10

## 2016-10-18 MED ORDER — MIDAZOLAM HCL 5 MG/5ML IJ SOLN
INTRAMUSCULAR | Status: DC | PRN
  Administered 2016-10-18: 2 mg via INTRAVENOUS

## 2016-10-18 MED ORDER — DIPHENHYDRAMINE HCL 50 MG/ML IJ SOLN
INTRAMUSCULAR | Status: DC | PRN
  Administered 2016-10-18: 12.5 mg via INTRAVENOUS

## 2016-10-18 MED ORDER — SODIUM CHLORIDE 0.9 % IV BOLUS (SEPSIS): 1000.0000 mL | Freq: Once | INTRAVENOUS | Status: DC

## 2016-10-18 SURGICAL SUPPLY — 55 items
APPLICATOR COTTON TIP 6IN STRL (MISCELLANEOUS) ×4 IMPLANT
CATH FOLEY 2WAY SLVR 18FR 30CC (CATHETERS) ×4 IMPLANT
CATH ROBINSON RED A/P 16FR (CATHETERS) ×4 IMPLANT
CATH ROBINSON RED A/P 8FR (CATHETERS) ×4 IMPLANT
CATH TIEMANN FOLEY 18FR 5CC (CATHETERS) ×4 IMPLANT
CHLORAPREP W/TINT 26ML (MISCELLANEOUS) ×4 IMPLANT
CLIP LIGATING HEM O LOK PURPLE (MISCELLANEOUS) ×8 IMPLANT
COVER SURGICAL LIGHT HANDLE (MISCELLANEOUS) ×4 IMPLANT
COVER TIP SHEARS 8 DVNC (MISCELLANEOUS) ×2 IMPLANT
COVER TIP SHEARS 8MM DA VINCI (MISCELLANEOUS) ×2
CUTTER ECHEON FLEX ENDO 45 340 (ENDOMECHANICALS) ×4 IMPLANT
DECANTER SPIKE VIAL GLASS SM (MISCELLANEOUS) IMPLANT
DERMABOND ADVANCED (GAUZE/BANDAGES/DRESSINGS)
DERMABOND ADVANCED .7 DNX12 (GAUZE/BANDAGES/DRESSINGS) IMPLANT
DRAPE ARM DVNC X/XI (DISPOSABLE) ×8 IMPLANT
DRAPE COLUMN DVNC XI (DISPOSABLE) ×2 IMPLANT
DRAPE DA VINCI XI ARM (DISPOSABLE) ×8
DRAPE DA VINCI XI COLUMN (DISPOSABLE) ×2
DRAPE SURG IRRIG POUCH 19X23 (DRAPES) ×4 IMPLANT
DRSG TEGADERM 4X4.75 (GAUZE/BANDAGES/DRESSINGS) ×4 IMPLANT
ELECT REM PT RETURN 9FT ADLT (ELECTROSURGICAL) ×4
ELECTRODE REM PT RTRN 9FT ADLT (ELECTROSURGICAL) ×2 IMPLANT
GLOVE BIO SURGEON STRL SZ 6.5 (GLOVE) ×3 IMPLANT
GLOVE BIO SURGEONS STRL SZ 6.5 (GLOVE) ×1
GLOVE BIOGEL M STRL SZ7.5 (GLOVE) ×8 IMPLANT
GOWN STRL REUS W/TWL LRG LVL3 (GOWN DISPOSABLE) ×12 IMPLANT
HOLDER FOLEY CATH W/STRAP (MISCELLANEOUS) ×4 IMPLANT
IRRIG SUCT STRYKERFLOW 2 WTIP (MISCELLANEOUS) ×4
IRRIGATION SUCT STRKRFLW 2 WTP (MISCELLANEOUS) ×2 IMPLANT
IV LACTATED RINGERS 1000ML (IV SOLUTION) IMPLANT
NDL SAFETY ECLIPSE 18X1.5 (NEEDLE) ×2 IMPLANT
NEEDLE HYPO 18GX1.5 SHARP (NEEDLE) ×2
PACK ROBOT UROLOGY CUSTOM (CUSTOM PROCEDURE TRAY) ×4 IMPLANT
RELOAD GREEN ECHELON 45 (STAPLE) ×4 IMPLANT
SEAL CANN UNIV 5-8 DVNC XI (MISCELLANEOUS) ×8 IMPLANT
SEAL XI 5MM-8MM UNIVERSAL (MISCELLANEOUS) ×8
SOLUTION ELECTROLUBE (MISCELLANEOUS) ×4 IMPLANT
SUT ETHILON 3 0 PS 1 (SUTURE) ×4 IMPLANT
SUT MNCRL 3 0 RB1 (SUTURE) ×2 IMPLANT
SUT MNCRL 3 0 VIOLET RB1 (SUTURE) ×2 IMPLANT
SUT MNCRL AB 4-0 PS2 18 (SUTURE) ×8 IMPLANT
SUT MONOCRYL 3 0 RB1 (SUTURE) ×4
SUT VIC AB 0 CT1 27 (SUTURE) ×2
SUT VIC AB 0 CT1 27XBRD ANTBC (SUTURE) ×2 IMPLANT
SUT VIC AB 0 UR5 27 (SUTURE) ×4 IMPLANT
SUT VIC AB 2-0 SH 27 (SUTURE) ×2
SUT VIC AB 2-0 SH 27X BRD (SUTURE) ×2 IMPLANT
SUT VIC AB 3-0 SH 27 (SUTURE) ×4
SUT VIC AB 3-0 SH 27XBRD (SUTURE) ×4 IMPLANT
SUT VICRYL 0 UR6 27IN ABS (SUTURE) ×8 IMPLANT
SYR 27GX1/2 1ML LL SAFETY (SYRINGE) ×4 IMPLANT
TOWEL OR 17X26 10 PK STRL BLUE (TOWEL DISPOSABLE) ×4 IMPLANT
TOWEL OR NON WOVEN STRL DISP B (DISPOSABLE) ×4 IMPLANT
TUBING INSUFFLATION 10FT LAP (TUBING) IMPLANT
WATER STERILE IRR 1500ML POUR (IV SOLUTION) IMPLANT

## 2016-10-18 NOTE — Discharge Instructions (Signed)
1. Activity:  You are encouraged to ambulate frequently (about every hour during waking hours) to help prevent blood clots from forming in your legs or lungs.  However, you should not engage in any heavy lifting (> 10-15 lbs), strenuous activity, or straining. 2. Diet: You should continue a clear liquid diet until passing gas from below.  Once this occurs, you may advance your diet to a soft diet that would be easy to digest (i.e soups, scrambled eggs, mashed potatoes, etc.) for 24 hours just as you would if getting over a bad stomach flu.  If tolerating this diet well for 24 hours, you may then begin eating regular food.  It will be normal to have some amount of bloating, nausea, and abdominal discomfort intermittently. 3. Prescriptions:  You will be provided a prescription for pain medication to take as needed.  If your pain is not severe enough to require the prescription pain medication, you may take Tylenol instead.  You should also take an over the counter stool softener (Colace 100 mg twice daily) to avoid straining with bowel movements as the pain medication may constipate you. Finally, you will also be provided a prescription for an antibiotic to begin the day prior to your return visit in the office for catheter removal. 4. Catheter care: You will be taught how to take care of the catheter by the nursing staff prior to discharge from the hospital.  You may use both a leg bag and the larger bedside bag but it is recommended to at least use the bigger bedside bag at nighttime as the leg bag is small and will fill up overnight and also does not drain as well when lying flat. You may periodically feel a strong urge to void with the catheter in place.  This is a bladder spasm and most often can occur when having a bowel movement or when you are moving around. It is typically self-limited and usually will stop after a few minutes.  You may use some Vaseline or Neosporin around the tip of the catheter to  reduce friction at the tip of the penis. 5. Incisions: You may remove your dressing bandages the 2nd day after surgery.  You most likely will have a few small staples in each of the incisions and once the bandages are removed, the incisions may stay open to air.  You may start showering (not soaking or bathing in water) 48 hours after surgery and the incisions simply need to be patted dry after the shower.  No additional care is needed. 6. What to call us about: You should call the office ([phone]) if you develop fever > 101, persistent vomiting, or the catheter stops draining. Also, feel free to call with any other questions you may have and remember the handout that was provided to you as a reference preoperatively which answers many of the common questions that arise after surgery. 7. You may resume advil, aleve, vitamins, and supplements 7 days after surgery.  

## 2016-10-18 NOTE — Anesthesia Procedure Notes (Signed)
Procedure Name: Intubation Date/Time: 10/18/2016 7:19 AM Performed by: Montel Clock Pre-anesthesia Checklist: Patient identified, Emergency Drugs available, Suction available, Patient being monitored and Timeout performed Patient Re-evaluated:Patient Re-evaluated prior to inductionOxygen Delivery Method: Circle system utilized Preoxygenation: Pre-oxygenation with 100% oxygen Intubation Type: IV induction Ventilation: Mask ventilation without difficulty, Oral airway inserted - appropriate to patient size and Two handed mask ventilation required Laryngoscope Size: Mac and 3 Grade View: Grade I Tube type: Oral Tube size: 7.5 mm Number of attempts: 1 Airway Equipment and Method: Stylet Placement Confirmation: ETT inserted through vocal cords under direct vision,  positive ETCO2 and breath sounds checked- equal and bilateral Secured at: 23 cm Tube secured with: Tape Dental Injury: Teeth and Oropharynx as per pre-operative assessment

## 2016-10-18 NOTE — Interval H&P Note (Signed)
History and Physical Interval Note:  10/18/2016 6:53 AM  Albert Graham  has presented today for surgery, with the diagnosis of PROSTATE CANCER  The various methods of treatment have been discussed with the patient and family. After consideration of risks, benefits and other options for treatment, the patient has consented to  Procedure(s): XI ROBOTIC ASSISTED LAPAROSCOPIC RADICAL PROSTATECTOMY LEVEL 2 (N/A) PELVIC LYMPHADENECTOMY (Bilateral) as a surgical intervention .  The patient's history has been reviewed, patient examined, no change in status, stable for surgery.  I have reviewed the patient's chart and labs.  Questions were answered to the patient's satisfaction.     Symphany Fleissner,LES

## 2016-10-18 NOTE — Progress Notes (Signed)
Post-op note  Subjective: The patient is doing well.  No complaints.  Denies N/V.  Objective: Vital signs in last 24 hours: Temp:  [98.2 F (36.8 C)-99.4 F (37.4 C)] 98.2 F (36.8 C) (02/08 1450) Pulse Rate:  [84-101] 88 (02/08 1450) Resp:  [10-23] 18 (02/08 1450) BP: (103-127)/(70-84) 111/76 (02/08 1450) SpO2:  [96 %-100 %] 97 % (02/08 1450) Weight:  [83.5 kg (184 lb)] 83.5 kg (184 lb) (02/08 0545)  Intake/Output from previous day: No intake/output data recorded. Intake/Output this shift: Total I/O In: 3050 [I.V.:2050; IV Piggyback:1000] Out: 250 [Urine:100; Drains:50; Blood:100]  Physical Exam:  General: Alert and oriented. Abdomen: Soft, Nondistended. Incisions: Clean and dry. Urine: red  Lab Results:  Recent Labs  10/16/16 1430 10/18/16 1232  HGB 15.8 14.0  HCT 46.1 41.3    Assessment/Plan: POD#0   1) Continue to monitor  2) DVT prophy, clears, IS, amb, pain control   LOS: 0 days   Bayan Kushnir 10/18/2016, 3:32 PM

## 2016-10-18 NOTE — Op Note (Signed)
Preoperative diagnosis: Clinically localized adenocarcinoam of the prostate (clinical stage T2a Nx Mx)  Postoperative diagnosis: Clinically localized adenocarcinoma of the prostate (clinical stage T2a Nx Mx)  Procedure:  1. Robotic assisted laparoscopic radical prostatectomy (bilateral nerve sparing) 2. Bilateral robotic assisted laparoscopic pelvic lymphadenectomy  Surgeon: Pryor Curia. M.D.  Assistant: Debbrah Alar, PA-C  An assistant was required for this surgical procedure.  The duties of the assistant included but were not limited to suctioning, passing suture, camera manipulation, retraction. This procedure would not be able to be performed without an Environmental consultant.  Anesthesia: General  Complications: None  EBL: 100 mL  IVF:  1500 mL crystalloid  Specimens: 1. Prostate and seminal vesicles 2. Right pelvic lymph nodes 3. Left pelvic lymph nodes  Disposition of specimens: Pathology  Drains: 1. 20 Fr coude catheter 2. # 19 Blake pelvic drain  Indication: Albert Graham is a 66 y.o. year old patient with clinically localized prostate cancer.  After a thorough review of the management options for treatment of prostate cancer, he elected to proceed with surgical therapy and the above procedure(s).  We have discussed the potential benefits and risks of the procedure, side effects of the proposed treatment, the likelihood of the patient achieving the goals of the procedure, and any potential problems that might occur during the procedure or recuperation. Informed consent has been obtained.  Description of procedure:  The patient was taken to the operating room and a general anesthetic was administered. He was given preoperative antibiotics, placed in the dorsal lithotomy position, and prepped and draped in the usual sterile fashion. Next a preoperative timeout was performed. A urethral catheter was placed into the bladder and a site was selected near the umbilicus for  placement of the camera port. This was placed using a standard open Hassan technique which allowed entry into the peritoneal cavity under direct vision and without difficulty. An 8 mm robotic port was placed and a pneumoperitoneum established. The camera was then used to inspect the abdomen and there was no evidence of any intra-abdominal injuries or other abnormalities. The remaining abdominal ports were then placed. 8 mm robotic ports were placed in the right lower quadrant, left lower quadrant, and far left lateral abdominal wall. A 5 mm port was placed in the right upper quadrant and a 12 mm port was placed in the right lateral abdominal wall for laparoscopic assistance. All ports were placed under direct vision without difficulty. The surgical cart was then docked.   Utilizing the cautery scissors, the bladder was reflected posteriorly allowing entry into the space of Retzius and identification of the endopelvic fascia and prostate. The periprostatic fat was then removed from the prostate allowing full exposure of the endopelvic fascia. The endopelvic fascia was then incised from the apex back to the base of the prostate bilaterally and the underlying levator muscle fibers were swept laterally off the prostate thereby isolating the dorsal venous complex. The dorsal vein was then stapled and divided with a 45 mm Flex Echelon stapler. Attention then turned to the bladder neck which was divided anteriorly thereby allowing entry into the bladder and exposure of the urethral catheter. The catheter balloon was deflated and the catheter was brought into the operative field and used to retract the prostate anteriorly. The posterior bladder neck was then examined and was divided allowing further dissection between the bladder and prostate posteriorly until the vasa deferentia and seminal vessels were identified. During this dissection, the patient was noted to have  a large lateral lobes of the prostate pushing  intravesically requiring a wider bladder neck dissection. The vasa deferentia were isolated, divided, and lifted anteriorly. The seminal vesicles were dissected down to their tips with care to control the seminal vascular arterial blood supply. These structures were then lifted anteriorly and the space between Denonvillier's fascia and the anterior rectum was developed with a combination of sharp and blunt dissection. This isolated the vascular pedicles of the prostate.  The lateral prostatic fascia was then sharply incised allowing release of the neurovascular bundles bilaterally. The vascular pedicles of the prostate were then ligated with Weck clips between the prostate and neurovascular bundles and divided with sharp cold scissor dissection resulting in neurovascular bundle preservation. The neurovascular bundles were then separated off the apex of the prostate and urethra bilaterally.  The urethra was then sharply transected allowing the prostate specimen to be disarticulated. The pelvis was copiously irrigated and hemostasis was ensured. There was no evidence for rectal injury.  Attention then turned to the right pelvic sidewall. The fibrofatty tissue between the external iliac vein, confluence of the iliac vessels, hypogastric artery, and Cooper's ligament was dissected free from the pelvic sidewall with care to preserve the obturator nerve. Weck clips were used for lymphostasis and hemostasis. An identical procedure was performed on the contralateral side and the lymphatic packets were removed for permanent pathologic analysis.  Attention then turned to the urethral anastomosis. A 2-0 Vicryl slip knot was placed between Denonvillier's fascia, the posterior bladder neck, and the posterior urethra to reapproximate these structures.  The bladder neck did first require reconstruction due to its size and it was noted to be thin with a small tear noted in the posterior midline.  A double-armed 3-0 Monocryl  suture was then used to perform a 360 running tension-free anastomosis between the bladder neck and urethra.  The posterior midline tear was repaired with the Monocryl running suture which was incorporated into the anastomosis.  A new urethral catheter was then placed into the bladder and irrigated. There were no blood clots within the bladder and the anastomosis appeared to be watertight. A #19 Blake drain was then brought through the left lateral 8 mm port site and positioned appropriately within the pelvis. It was secured to the skin with a nylon suture. The surgical cart was then undocked. The right lateral 12 mm port site was closed at the fascial level with a 0 Vicryl suture placed laparoscopically. All remaining ports were then removed under direct vision. The prostate specimen was removed intact within the Endopouch retrieval bag via the periumbilical camera port site. This fascial opening was closed with two running 0 Vicryl sutures. 0.25% Marcaine was then injected into all port sites and all incisions were reapproximated at the skin level with 4-0 Monocryl subcuticular sutures and Liquiband. The patient appeared to tolerate the procedure well and without complications. The patient was able to be extubated and transferred to the recovery unit in satisfactory condition.   Pryor Curia MD

## 2016-10-18 NOTE — Transfer of Care (Signed)
Immediate Anesthesia Transfer of Care Note  Patient: NOEMI BRISSEY  Procedure(s) Performed: Procedure(s): XI ROBOTIC ASSISTED LAPAROSCOPIC RADICAL PROSTATECTOMY LEVEL 2 (N/A) PELVIC LYMPHADENECTOMY (Bilateral)  Patient Location: PACU  Anesthesia Type:General  Level of Consciousness:  sedated, patient cooperative and responds to stimulation  Airway & Oxygen Therapy:Patient Spontanous Breathing and Patient connected to face mask oxgen  Post-op Assessment:  Report given to PACU RN and Post -op Vital signs reviewed and stable  Post vital signs:  Reviewed and stable  Last Vitals:  Vitals:   10/18/16 0529  BP: 127/84  Pulse: (!) 101  Resp: 16  Temp: 37 C    Complications: No apparent anesthesia complications   Patients Stated Pain Goal: 4 ([id number])  Complications: No apparent anesthesia complications

## 2016-10-18 NOTE — Anesthesia Preprocedure Evaluation (Addendum)

## 2016-10-18 NOTE — Progress Notes (Signed)
Hgb. And Hct. Drawn by lab. 

## 2016-10-18 NOTE — Progress Notes (Signed)
Lab results noted- Hgb. 14.0- Hct. 41.3

## 2016-10-18 NOTE — Progress Notes (Signed)
Patient ambulated around 90 feet in hallway with standby assist. Tolerated well, sitting up in chair after walk.

## 2016-10-19 ENCOUNTER — Encounter (HOSPITAL_COMMUNITY): Payer: Self-pay | Admitting: Urology

## 2016-10-19 DIAGNOSIS — C61 Malignant neoplasm of prostate: Secondary | ICD-10-CM | POA: Diagnosis not present

## 2016-10-19 LAB — HEMOGLOBIN AND HEMATOCRIT, BLOOD
HEMATOCRIT: 38.8 % — AB (ref 39.0–52.0)
HEMOGLOBIN: 12.9 g/dL — AB (ref 13.0–17.0)

## 2016-10-19 MED ORDER — BISACODYL 10 MG RE SUPP
10.0000 mg | Freq: Once | RECTAL | Status: AC
  Administered 2016-10-19: 10 mg via RECTAL
  Filled 2016-10-19: qty 1

## 2016-10-19 MED ORDER — HYDROCODONE-ACETAMINOPHEN 5-325 MG PO TABS: 1.0000 | ORAL_TABLET | ORAL | Status: DC | PRN

## 2016-10-19 NOTE — Anesthesia Postprocedure Evaluation (Signed)
Anesthesia Post Note  Patient: REA PARCHEM  Procedure(s) Performed: Procedure(s) (LRB): XI ROBOTIC ASSISTED LAPAROSCOPIC RADICAL PROSTATECTOMY LEVEL 2 (N/A) PELVIC LYMPHADENECTOMY (Bilateral)  Patient location during evaluation: PACU Anesthesia Type: General Level of consciousness: awake and awake and alert Pain management: pain level controlled Vital Signs Assessment: post-procedure vital signs reviewed and stable Respiratory status: spontaneous breathing, nonlabored ventilation and respiratory function stable Postop Assessment: no headache Anesthetic complications: no       Last Vitals:  Vitals:   10/18/16 2307 10/19/16 0501  BP: 106/68 113/67  Pulse: 68 63  Resp: 18 20  Temp: 36.5 C 37 C    Last Pain:  Vitals:   10/19/16 0854  TempSrc:   PainSc: 0-No pain                 Dorthia Tout COKER

## 2016-10-19 NOTE — Progress Notes (Signed)
1 Day Post-Op Subjective: The patient is doing well.  No nausea or vomiting. Pain is adequately controlled. Excellent UOP, drain output not elevated.  Objective: Vital signs in last 24 hours: Temp:  [97.7 F (36.5 C)-99.4 F (37.4 C)] 98.6 F (37 C) (02/09 0501) Pulse Rate:  [63-101] 63 (02/09 0501) Resp:  [10-23] 20 (02/09 0501) BP: (103-113)/(67-77) 113/67 (02/09 0501) SpO2:  [96 %-100 %] 97 % (02/09 0501)  Intake/Output from previous day: 02/08 0701 - 02/09 0700 In: 5462.5 [I.V.:4412.5; IV Piggyback:1050] Out: 820 [Urine:600; Drains:120; Blood:100] Intake/Output this shift: No intake/output data recorded.  Physical Exam:  General: Alert and oriented. CV: RRR Lungs: Clear bilaterally. GI: Soft, Nondistended. Incisions: Clean, dry, and intact. Drain with small amount of thin SS fluid Urine: Clear, slight amber color Extremities: Nontender, no erythema, no edema.  Lab Results:  Recent Labs  10/16/16 1430 10/18/16 1232 10/19/16 0426  HGB 15.8 14.0 12.9*  HCT 46.1 41.3 38.8*      Assessment/Plan: POD# 1 s/p robotic prostatectomy.  1) SL IVF 2) Ambulate, Incentive spirometry 3) Transition to oral pain medication 4) Dulcolax suppository 5) D/C pelvic drain 6) Plan for likely discharge later today    LOS: 1 day   Albert Graham Albert Graham 10/19/2016, 7:03 AM

## 2016-10-19 NOTE — Care Management Note (Signed)
Case Management Note  Patient Details  Name: Albert Graham MRN: [id number] Date of Birth: March 04, 1951  Subjective/Objective: 67 y/o m admitted w/Prostate Ca. From home.                   Action/Plan:d/c plan home.   Expected Discharge Date:  10/19/16               Expected Discharge Plan:  Home/Self Care  In-House Referral:     Discharge planning Services  CM Consult  Post Acute Care Choice:    Choice offered to:     DME Arranged:    DME Agency:     HH Arranged:    HH Agency:     Status of Service:  Completed, signed off  If discussed at [employer] of Stay Meetings, dates discussed:    Additional Comments:  Dessa Phi, RN 10/19/2016, 12:52 PM

## 2016-10-20 ENCOUNTER — Encounter (HOSPITAL_COMMUNITY): Payer: Self-pay | Admitting: *Deleted

## 2016-10-20 ENCOUNTER — Emergency Department (HOSPITAL_COMMUNITY)
Admission: EM | Admit: 2016-10-20 | Discharge: 2016-10-20 | Disposition: A | Discharge status: Home / Self Care | Payer: BLUE CROSS/BLUE SHIELD | Attending: Emergency Medicine | Admitting: Emergency Medicine

## 2016-10-20 ENCOUNTER — Emergency Department (HOSPITAL_COMMUNITY): Payer: BLUE CROSS/BLUE SHIELD

## 2016-10-20 DIAGNOSIS — Z8546 Personal history of malignant neoplasm of prostate: Secondary | ICD-10-CM | POA: Insufficient documentation

## 2016-10-20 DIAGNOSIS — R509 Fever, unspecified: Secondary | ICD-10-CM | POA: Diagnosis present

## 2016-10-20 DIAGNOSIS — Z7982 Long term (current) use of aspirin: Secondary | ICD-10-CM | POA: Diagnosis not present

## 2016-10-20 DIAGNOSIS — Z79899 Other long term (current) drug therapy: Secondary | ICD-10-CM | POA: Insufficient documentation

## 2016-10-20 DIAGNOSIS — R5082 Postprocedural fever: Secondary | ICD-10-CM | POA: Insufficient documentation

## 2016-10-20 LAB — CBC WITH DIFFERENTIAL/PLATELET
Basophils Absolute: 0 10*3/uL (ref 0.0–0.1)
Basophils Relative: 0 %
Eosinophils Absolute: 0 10*3/uL (ref 0.0–0.7)
Eosinophils Relative: 0 %
HCT: 40.9 % (ref 39.0–52.0)
Hemoglobin: 14.1 g/dL (ref 13.0–17.0)
Lymphocytes Relative: 9 %
Lymphs Abs: 1.1 10*3/uL (ref 0.7–4.0)
MCH: 29.2 pg (ref 26.0–34.0)
MCHC: 34.5 g/dL (ref 30.0–36.0)
MCV: 84.7 fL (ref 78.0–100.0)
Monocytes Absolute: 1.1 10*3/uL — ABNORMAL HIGH (ref 0.1–1.0)
Monocytes Relative: 9 %
Neutro Abs: 9.8 10*3/uL — ABNORMAL HIGH (ref 1.7–7.7)
Neutrophils Relative %: 82 %
Platelets: 140 10*3/uL — ABNORMAL LOW (ref 150–400)
RBC: 4.83 MIL/uL (ref 4.22–5.81)
RDW: 12.9 % (ref 11.5–15.5)
WBC: 11.9 10*3/uL — ABNORMAL HIGH (ref 4.0–10.5)

## 2016-10-20 LAB — URINALYSIS, ROUTINE W REFLEX MICROSCOPIC
Bilirubin Urine: NEGATIVE
Glucose, UA: NEGATIVE mg/dL
Ketones, ur: 80 mg/dL — AB
Nitrite: NEGATIVE
Protein, ur: 100 mg/dL — AB
Specific Gravity, Urine: 1.016 (ref 1.005–1.030)
Squamous Epithelial / LPF: NONE SEEN
pH: 6 (ref 5.0–8.0)

## 2016-10-20 LAB — BASIC METABOLIC PANEL
Anion gap: 11 (ref 5–15)
BUN: 14 mg/dL (ref 6–20)
CO2: 23 mmol/L (ref 22–32)
Calcium: 8.8 mg/dL — ABNORMAL LOW (ref 8.9–10.3)
Chloride: 101 mmol/L (ref 101–111)
Creatinine, Ser: 0.95 mg/dL (ref 0.61–1.24)
GFR calc Af Amer: >60 mL/min (ref >60)
GFR calc non Af Amer: >60 mL/min (ref >60)
Glucose, Bld: 101 mg/dL — ABNORMAL HIGH (ref 65–99)
Potassium: 4 mmol/L (ref 3.5–5.1)
Sodium: 135 mmol/L (ref 135–145)

## 2016-10-20 LAB — LACTIC ACID, PLASMA: Lactic Acid, Venous: 0.9 mmol/L (ref 0.5–1.9)

## 2016-10-20 MED ORDER — AZITHROMYCIN 250 MG PO TABS
500.0000 mg | ORAL_TABLET | Freq: Once | ORAL | Status: AC
  Administered 2016-10-20: 500 mg via ORAL
  Filled 2016-10-20: qty 2

## 2016-10-20 MED ORDER — AMOXICILLIN 500 MG PO CAPS: 500.0000 mg | ORAL_CAPSULE | Freq: Three times a day (TID) | ORAL | 0 refills | Status: DC

## 2016-10-20 MED ORDER — DEXTROSE 5 % IV SOLN
1.0000 g | Freq: Once | INTRAVENOUS | Status: AC
  Administered 2016-10-20: 1 g via INTRAVENOUS
  Filled 2016-10-20: qty 10

## 2016-10-20 MED ORDER — ACETAMINOPHEN 500 MG PO TABS
1000.0000 mg | ORAL_TABLET | Freq: Once | ORAL | Status: AC
  Administered 2016-10-20: 1000 mg via ORAL
  Filled 2016-10-20: qty 2

## 2016-10-20 MED ORDER — AZITHROMYCIN 250 MG PO TABS: 250.0000 mg | ORAL_TABLET | Freq: Every day | ORAL | 0 refills | Status: DC

## 2016-10-20 MED ORDER — SODIUM CHLORIDE 0.9 % IV BOLUS (SEPSIS)
1000.0000 mL | Freq: Once | INTRAVENOUS | Status: AC
  Administered 2016-10-20: 1000 mL via INTRAVENOUS

## 2016-10-20 NOTE — ED Notes (Signed)
Patient d/c'd self care.  F/U and medications reviewed.  Patient verbalized understanding. 

## 2016-10-20 NOTE — Discharge Summary (Signed)
Date of admission: 10/18/2016  Date of discharge: 10/19/2016  Admission diagnosis: Prostate Cancer  Discharge diagnosis: Prostate Cancer  History and Physical: For full details, please see admission history and physical. Briefly, Albert Graham is a 66 y.o. gentleman with localized prostate cancer.  After discussing management/treatment options, he elected to proceed with surgical treatment.  Hospital Course: Albert Graham was taken to the operating room on 10/18/2016 and underwent a robotic assisted laparoscopic radical prostatectomy. He tolerated this procedure well and without complications. Postoperatively, he was able to be transferred to a regular hospital room following recovery from anesthesia.  He was able to begin ambulating the night of surgery. He remained hemodynamically stable overnight.  He had excellent urine output with appropriately minimal output from his pelvic drain and his pelvic drain was removed on POD #1.  He was transitioned to oral pain medication, tolerated a clear liquid diet, and had met all discharge criteria and was able to be discharged home later on POD#1.  Laboratory values:  Recent Labs  10/18/16 1232 10/19/16 0426  HGB 14.0 12.9*  HCT 41.3 38.8*    Disposition: Home  Discharge instruction: He was instructed to be ambulatory but to refrain from heavy lifting, strenuous activity, or driving. He was instructed on urethral catheter care.  Discharge medications:  Allergies as of 10/19/2016      Reactions   Codeine Nausea And Vomiting      Medication List    STOP taking these medications   Co-Enzyme Q-10 100 MG Caps   diclofenac 75 MG EC tablet Commonly known as:  VOLTAREN   multivitamin with minerals Tabs tablet   traMADol 50 MG tablet Commonly known as:  Roni Bread COLD REMEDY Tbdp     TAKE these medications   aspirin EC 81 MG tablet Take 81 mg by mouth 2 (two) times daily.   atorvastatin 10 MG tablet Commonly known as:  LIPITOR Take 5 mg  by mouth at bedtime.   diphenhydrAMINE 25 mg capsule Commonly known as:  BENADRYL Take 25 mg by mouth every 6 (six) hours as needed (for runny nose/allergies.).   HYDROcodone-acetaminophen 5-325 MG tablet Commonly known as:  NORCO Take 1-2 tablets by mouth every 6 (six) hours as needed for moderate pain or severe pain.   sulfamethoxazole-trimethoprim 800-160 MG tablet Commonly known as:  BACTRIM DS,SEPTRA DS Take 1 tablet by mouth 2 (two) times daily. Start the day prior to foley removal appointment   VOLTAREN 1 % Gel Generic drug:  diclofenac sodium Apply 2 g topically at bedtime as needed (for back pain.).       Followup: He will followup in 1 week for catheter removal and to discuss his surgical pathology results.

## 2016-10-20 NOTE — ED Notes (Signed)
Pt ambulated to bathroom to have a BM w/assist to hold his foley bag but otherwise he is steady on his feet.

## 2016-10-20 NOTE — ED Provider Notes (Signed)
[facility] DEPT Provider Note   CSN: [id number] Arrival date & time: 10/20/16  1527  By signing my name below, I, Reola Mosher, attest that this documentation has been prepared under the direction and in the presence of Virgel Manifold, MD. Electronically Signed: Reola Mosher, ED Scribe. 10/20/16. 4:13 PM.  History   Chief Complaint Chief Complaint  Patient presents with  . Fever   The history is provided by the patient, medical records, a relative and the spouse. No language interpreter was used.    HPI Comments: Albert Graham is a 66 y.o. male who is two days s/p laparoscopic radical prostatectomy level II performed by Dr Raynelle Bring, with a h/o prostate cancer and prior DVT, who presents to the Emergency Department complaining of gradual onset, waxing and waning fever (Tmax 101.7) beginning yesterday. Per wife, pt was d/c from his surgery yesterday without complication in the hospital, however, last night she notes that he stated that he felt hot and that his measured oral temperature had increased above 100. They note that his surgeon had advised them that there may be a fever after surgery, but his fever has been otherwise non-controlled at home. He has had a foley catheter in place since his surgery, and per relatives he has been passing small amounts of clotted blood since his surgery. This has been improving and it has otherwise been draining normally otherwise. Per family member, he has been having small amounts of bloody d/c at the tip of his penis at catheter insertion as well, but pt denies any penile pain. They additionally report that he has been experiencing mild abdominal pain surrounding his abdominal incision site and sensation of fullness into his esophagus after any PO intake. Pt notes that he has been able to pass flatus since his surgery but he has not had a bowel movement yet. No antipyretics were tried prior to coming into the ED for his fever. Pt denies  cough, nausea, vomiting, diarrhea, generalized myalgias, sore throat, pain otherwise, or any other associated symptoms.   Past Medical History:  Diagnosis Date  . Arthritis   . Back pain   . Blood clot associated with vein wall inflammation 20 yrs ago   left leg   . Cancer [facility])    prostate   . Dyslipidemia   . Hemorrhoids   . HOH (hard of hearing)    both ears  . Joint pain   . Kidney stone   . PONV (postoperative nausea and vomiting)    likes phenergan   Patient Active Problem List   Diagnosis Date Noted  . Prostate cancer ([place]) 10/18/2016  . Diarrhea 08/13/2016  . Rectal bleeding 08/13/2016   Past Surgical History:  Procedure Laterality Date  . BACK SURGERY  12/31/2013   lower  . foot surgery for arthritis Bilateral    left leg tendon cut also  . LYMPHADENECTOMY Bilateral 10/18/2016   Procedure: PELVIC LYMPHADENECTOMY;  Surgeon: Raynelle Bring, MD;  Location: [facility] ORS;  Service: Urology;  Laterality: Bilateral;  . ROBOT ASSISTED LAPAROSCOPIC RADICAL PROSTATECTOMY N/A 10/18/2016   Procedure: XI ROBOTIC ASSISTED LAPAROSCOPIC RADICAL PROSTATECTOMY LEVEL 2;  Surgeon: Raynelle Bring, MD;  Location: [facility];  Service: Urology;  Laterality: N/A;    Home Medications    Prior to Admission medications   Medication Sig Start Date End Date Taking? Authorizing Provider  aspirin EC 81 MG tablet Take 81 mg by mouth 2 (two) times daily.    Historical Provider, MD  atorvastatin (  LIPITOR) 10 MG tablet Take 5 mg by mouth at bedtime.  06/26/16   Historical Provider, MD  diclofenac sodium (VOLTAREN) 1 % GEL Apply 2 g topically at bedtime as needed (for back pain.).     Historical Provider, MD  diphenhydrAMINE (BENADRYL) 25 mg capsule Take 25 mg by mouth every 6 (six) hours as needed (for runny nose/allergies.).    Historical Provider, MD  HYDROcodone-acetaminophen (NORCO) 5-325 MG tablet Take 1-2 tablets by mouth every 6 (six) hours as needed for moderate pain or severe pain. 10/18/16   Debbrah Alar,  PA-C  sulfamethoxazole-trimethoprim (BACTRIM DS,SEPTRA DS) 800-160 MG tablet Take 1 tablet by mouth 2 (two) times daily. Start the day prior to foley removal appointment 10/18/16   Debbrah Alar, PA-C   Family History Family History  Problem Relation Age of Onset  . Stomach cancer Father   . Colon cancer Neg Hx    Social History Social History  Substance Use Topics  . Smoking status: Never Smoker  . Smokeless tobacco: Never Used  . Alcohol use No   Allergies   Codeine  Review of Systems Review of Systems  Constitutional: Positive for fever (Tmax 101.7).  HENT: Negative for sore throat.   Respiratory: Negative for cough.   Gastrointestinal: Positive for abdominal pain (2/2 PO intake, surrounding abdominal incisional site). Negative for diarrhea, nausea and vomiting.  Genitourinary: Positive for discharge and hematuria. Negative for difficulty urinating and penile pain.  Musculoskeletal: Negative for myalgias.  All other systems reviewed and are negative.  Physical Exam Updated Vital Signs BP 124/83 (BP Location: Left Arm)   Pulse (!) 128   Temp 100.1 F (37.8 C) (Oral)   Resp 18   Ht 5\' 7"  (1.702 m)   Wt 185 lb (83.9 kg)   SpO2 94%   BMI 28.98 kg/m   Physical Exam  Constitutional: He appears well-developed and well-nourished.  HENT:  Head: Normocephalic.  Right Ear: External ear normal.  Left Ear: External ear normal.  Nose: Nose normal.  Eyes: Conjunctivae are normal. Right eye exhibits no discharge. Left eye exhibits no discharge.  Neck: Normal range of motion.  Cardiovascular: Regular rhythm and normal heart sounds.  Tachycardia present.   No murmur heard. Mild tachycardia.   Pulmonary/Chest: Effort normal and breath sounds normal. No respiratory distress. He has no wheezes. He has no rales.  Abdominal: Soft. There is tenderness. There is no rebound and no guarding.  Surgical incision is intact and appears to be healing well. Former drain site to left abdomen  appears to be healing appropriately. Mild diffuse tenderness which seems appropriate given recent procedure.   Musculoskeletal: Normal range of motion. He exhibits no edema or tenderness.  Neurological: He is alert. No cranial nerve deficit. Coordination normal.  Skin: Skin is warm and dry. No rash noted. No erythema. No pallor.  Psychiatric: He has a normal mood and affect. His behavior is normal.  Nursing note and vitals reviewed.  ED Treatments / Results  DIAGNOSTIC STUDIES: Oxygen Saturation is 94% on RA, adequate by my interpretation.   COORDINATION OF CARE: 4:09 PM-Discussed next steps with pt. Pt verbalized understanding and is agreeable with the plan.   Labs (all labs ordered are listed, but only abnormal results are displayed) Labs Reviewed  CBC WITH DIFFERENTIAL/PLATELET - Abnormal; Notable for the following:       Result Value   WBC 11.9 (*)    Platelets 140 (*)    Neutro Abs 9.8 (*)    Monocytes  Absolute 1.1 (*)    All other components within normal limits  BASIC METABOLIC PANEL - Abnormal; Notable for the following:    Glucose, Bld 101 (*)    Calcium 8.8 (*)    All other components within normal limits  URINALYSIS, ROUTINE W REFLEX MICROSCOPIC - Abnormal; Notable for the following:    APPearance HAZY (*)    Hgb urine dipstick LARGE (*)    Ketones, ur 80 (*)    Protein, ur 100 (*)    Leukocytes, UA TRACE (*)    Bacteria, UA RARE (*)    All other components within normal limits  LACTIC ACID, PLASMA    EKG  EKG Interpretation None      Radiology No results found.  Procedures Procedures   Medications Ordered in ED Medications - No data to display  Initial Impression / Assessment and Plan / ED Course  I have reviewed the triage vital signs and the nursing notes.  Pertinent labs & imaging results that were available during my care of the patient were reviewed by me and considered in my medical decision making (see chart for details).     Final  Clinical Impressions(s) / ED Diagnoses   Final diagnoses:  Postsurgical fever   New Prescriptions New Prescriptions   No medications on file   I personally preformed the services scribed in my presence. The recorded information has been reviewed is accurate. Virgel Manifold, MD.     Virgel Manifold, MD 10/31/16 [phone]

## 2016-10-20 NOTE — ED Triage Notes (Signed)
PT arrives via POV, d/c yesterday after prostate surgery. Wife reports low grade temp through the night, and tmax 101.7 today. No meds prior to arrival for the same. Pt has urinary catheter in place.

## 2016-12-13 ENCOUNTER — Ambulatory Visit (INDEPENDENT_AMBULATORY_CARE_PROVIDER_SITE_OTHER): Payer: Medicare Other | Admitting: Nurse Practitioner

## 2016-12-13 ENCOUNTER — Encounter: Payer: Self-pay | Admitting: Nurse Practitioner

## 2016-12-13 ENCOUNTER — Telehealth: Payer: Self-pay

## 2016-12-13 VITALS — BP 142/84 | HR 90 | Temp 98.1°F | Ht 67.0 in | Wt 188.6 lb

## 2016-12-13 DIAGNOSIS — K625 Hemorrhage of anus and rectum: Secondary | ICD-10-CM

## 2016-12-13 DIAGNOSIS — R197 Diarrhea, unspecified: Secondary | ICD-10-CM

## 2016-12-13 NOTE — Assessment & Plan Note (Signed)
Symptoms resolved about 1 or 2 weeks after his last visit. In hindsight diarrhea was likely due to antibiotic-induced diarrhea. Continue to monitor, return for follow-up as needed.

## 2016-12-13 NOTE — Assessment & Plan Note (Signed)
Rectal bleeding stopped when diarrhea stopped, approximately 1-2 weeks after his last visit. In hindsight his diarrhea was likely antibiotic related and bleeding from chronic/known hemorrhoids. Regardless, he is due for surveillance colonoscopy. He is undergoing evaluation for prostate cancer and is now 1 month status post prostate resection. He has a follow-up in May related to this to decide if radiation therapy is needed. At this point I will send a message to the surgeon asked when it is safe to proceed with colonoscopy after prostate resection, depending on [place] of approach. I will notify the patient when from a urological standpoint he can have colonoscopy. He'll then decide when to proceed with colonoscopy. This may be accomplished over the phone triage versus office visit if needed. Otherwise, return for follow-up as needed.

## 2016-12-13 NOTE — Telephone Encounter (Signed)
Caryl Pina from Dr.Borden's office ([facility]) called- she said Dr.Borden said it was ok from their standpoint  for the patient to have a tcs whenever he was ready.

## 2016-12-13 NOTE — Patient Instructions (Signed)
1. We will ensure it is okay with the urologist surgeon to proceed with colonoscopy. 2. We will let you know if it is. 3. We will have you call us and let us know when you're ready to proceed with colonoscopy. This may require an additional office visit versus triage over the phone. 4. Return for follow-up as needed otherwise.

## 2016-12-13 NOTE — Progress Notes (Signed)
Referring Provider: Glenda Chroman, MD Primary Care Physician:  Glenda Chroman, MD Primary GI:  Dr. Gala Romney  Chief Complaint  Patient presents with  . Rectal Bleeding    HPI:   Albert Graham is a 66 y.o. male who presents For follow-up on rectal bleeding. Last seen in our office 08/13/2016 for diarrhea, hemorrhoids, rectal bleeding. At that time it was noted he had been having GI bleeding for 6 weeks but it was beginning to improve. He was doing well overall. At that time he was on antibiotics pending prostate surgery. History of bleeding hemorrhoids. Noted significant hemorrhoid symptoms and protrusion about 3 weeks prior to his last visit. Just prior was visit he had his first "more normal" bowel movement in some time.   Last colonoscopy 2007 and recommended a repeat. He stated he would let us know about his wishes for timeframe for repeat colonoscopy based on results of his upcoming prostate biopsy and prostate surgery.  Today he states he's doing well. Diarrhea has resolved. Last episode of rectal bleeding was about 3.5-4 months ago. Denies abdominal pain, N/V, melena, unintentional weight loss, changes in bowel habits. Is now taking 2 stool softeners a day which helps, has 2 bowel movements a day, no straining, feels like he empties completely. Is due for repeat colonoscopy but is wanting to wait until his follow-up with urology. Has prostate cancer, not currently on treatment. At follow-up in May will decide if radiation is needed. Denies chest pain, dyspnea, dizziness, lightheadedness, syncope, near syncope. Denies any other upper or lower GI symptoms.  Past Medical History:  Diagnosis Date  . Arthritis   . Back pain   . Blood clot associated with vein wall inflammation 20 yrs ago   left leg   . Cancer [facility])    prostate   . Dyslipidemia   . Hemorrhoids   . HOH (hard of hearing)    both ears  . Joint pain   . Kidney stone   . PONV (postoperative nausea and vomiting)    likes  phenergan    Past Surgical History:  Procedure Laterality Date  . BACK SURGERY  12/31/2013   lower  . foot surgery for arthritis Bilateral    left leg tendon cut also  . LYMPHADENECTOMY Bilateral 10/18/2016   Procedure: PELVIC LYMPHADENECTOMY;  Surgeon: Raynelle Bring, MD;  Location: [facility];  Service: Urology;  Laterality: Bilateral;  . ROBOT ASSISTED LAPAROSCOPIC RADICAL PROSTATECTOMY N/A 10/18/2016   Procedure: XI ROBOTIC ASSISTED LAPAROSCOPIC RADICAL PROSTATECTOMY LEVEL 2;  Surgeon: Raynelle Bring, MD;  Location: [facility];  Service: Urology;  Laterality: N/A;    Current Outpatient Prescriptions  Medication Sig Dispense Refill  . acetaminophen (TYLENOL) 325 MG tablet Take 650 mg by mouth every 6 (six) hours as needed.    Marland Kitchen aspirin EC 81 MG tablet Take 81 mg by mouth 2 (two) times daily.    Marland Kitchen atorvastatin (LIPITOR) 10 MG tablet Take 5 mg by mouth at bedtime.   0  . diclofenac (VOLTAREN) 75 MG EC tablet Take 75 mg by mouth daily.    . diclofenac sodium (VOLTAREN) 1 % GEL Apply 2 g topically at bedtime as needed (for back pain.).     Marland Kitchen diphenhydrAMINE (BENADRYL) 25 mg capsule Take 25 mg by mouth every 6 (six) hours as needed (for runny nose/allergies.).     No current facility-administered medications for this visit.     Allergies as of 12/13/2016 - Review Complete 12/13/2016  Allergen  Reaction Noted  . Ciprofloxacin Diarrhea 10/20/2016  . Codeine Nausea And Vomiting 08/13/2016    Family History  Problem Relation Age of Onset  . Stomach cancer Father   . Colon cancer Neg Hx     Social History   Social History  . Marital status: Married    Spouse name: N/A  . Number of children: N/A  . Years of education: N/A   Social History Main Topics  . Smoking status: Never Smoker  . Smokeless tobacco: Never Used  . Alcohol use No  . Drug use: No  . Sexual activity: Not Asked   Other Topics Concern  . None   Social History Narrative  . None    Review of Systems: General:  Negative for anorexia, weight loss, fever, chills, fatigue, weakness. ENT: Negative for hoarseness, difficulty swallowing. CV: Negative for chest pain, angina, palpitations, peripheral edema.  Respiratory: Negative for dyspnea at rest, cough, sputum, wheezing.  GI: See history of present illness. Musculoskeletal: Admits chronic polyarthritis pain Endo: Negative for unusual weight change.  Heme: Negative for bruising or bleeding.   Physical Exam: BP (!) 142/84   Pulse 90   Temp 98.1 F (36.7 C) (Oral)   Ht 5\' 7"  (1.702 m)   Wt 188 lb 9.6 oz (85.5 kg)   BMI 29.54 kg/m  General:   Alert and oriented. Pleasant and cooperative. Well-nourished and well-developed.  Eyes:  Without icterus, sclera clear and conjunctiva pink.  Throat/Neck:  Supple, without mass or thyromegaly. Cardiovascular:  S1, S2 present without murmurs appreciated. Normal pulses noted. Extremities without clubbing or edema. Respiratory:  Clear to auscultation bilaterally. No wheezes, rales, or rhonchi. No distress.  Gastrointestinal:  +BS, soft, non-tender and non-distended. No HSM noted. No guarding or rebound. No masses appreciated.  Rectal:  Deferred  Musculoskalatal:  Symmetrical without gross deformities. Neurologic:  Alert and oriented x4;  grossly normal neurologically. Psych:  Alert and cooperative. Normal mood and affect. Heme/Lymph/Immune: No excessive bruising noted.    12/13/2016 10:43 AM   Disclaimer: This note was dictated with voice recognition software. Similar sounding words can inadvertently be transcribed and may not be corrected upon review.

## 2016-12-13 NOTE — Progress Notes (Signed)
CC'ED TO PCP 

## 2016-12-14 NOTE — Telephone Encounter (Signed)
Noted. Please notify the patient that urology is ok with colonoscopy at any point moving forward. The decision is now his as to when to proceed.

## 2016-12-17 NOTE — Telephone Encounter (Signed)
Tried to call pt- NA- LMOM with recommendations.  

## 2016-12-18 NOTE — Telephone Encounter (Signed)
I have also mailed a letter.

## 2016-12-27 DIAGNOSIS — Z789 Other specified health status: Secondary | ICD-10-CM | POA: Diagnosis not present

## 2016-12-27 DIAGNOSIS — Z299 Encounter for prophylactic measures, unspecified: Secondary | ICD-10-CM | POA: Diagnosis not present

## 2016-12-27 DIAGNOSIS — Z6828 Body mass index (BMI) 28.0-28.9, adult: Secondary | ICD-10-CM | POA: Diagnosis not present

## 2016-12-27 DIAGNOSIS — Z713 Dietary counseling and surveillance: Secondary | ICD-10-CM | POA: Diagnosis not present

## 2016-12-27 DIAGNOSIS — J029 Acute pharyngitis, unspecified: Secondary | ICD-10-CM | POA: Diagnosis not present

## 2017-01-22 DIAGNOSIS — R5383 Other fatigue: Secondary | ICD-10-CM | POA: Diagnosis not present

## 2017-01-22 DIAGNOSIS — M1990 Unspecified osteoarthritis, unspecified site: Secondary | ICD-10-CM | POA: Diagnosis not present

## 2017-01-22 DIAGNOSIS — Z0000 Encounter for general adult medical examination without abnormal findings: Secondary | ICD-10-CM | POA: Diagnosis not present

## 2017-01-22 DIAGNOSIS — Z79899 Other long term (current) drug therapy: Secondary | ICD-10-CM | POA: Diagnosis not present

## 2017-01-22 DIAGNOSIS — E7800 Pure hypercholesterolemia, unspecified: Secondary | ICD-10-CM | POA: Diagnosis not present

## 2017-01-22 DIAGNOSIS — Z7189 Other specified counseling: Secondary | ICD-10-CM | POA: Diagnosis not present

## 2017-01-22 DIAGNOSIS — Z299 Encounter for prophylactic measures, unspecified: Secondary | ICD-10-CM | POA: Diagnosis not present

## 2017-01-22 DIAGNOSIS — Z1389 Encounter for screening for other disorder: Secondary | ICD-10-CM | POA: Diagnosis not present

## 2017-01-22 DIAGNOSIS — Z789 Other specified health status: Secondary | ICD-10-CM | POA: Diagnosis not present

## 2017-01-22 DIAGNOSIS — Z6828 Body mass index (BMI) 28.0-28.9, adult: Secondary | ICD-10-CM | POA: Diagnosis not present

## 2017-01-22 DIAGNOSIS — Z1211 Encounter for screening for malignant neoplasm of colon: Secondary | ICD-10-CM | POA: Diagnosis not present

## 2017-02-06 DIAGNOSIS — N393 Stress incontinence (female) (male): Secondary | ICD-10-CM | POA: Diagnosis not present

## 2017-02-06 DIAGNOSIS — C61 Malignant neoplasm of prostate: Secondary | ICD-10-CM | POA: Diagnosis not present

## 2017-02-27 DIAGNOSIS — M47816 Spondylosis without myelopathy or radiculopathy, lumbar region: Secondary | ICD-10-CM | POA: Diagnosis not present

## 2017-05-28 DIAGNOSIS — M47816 Spondylosis without myelopathy or radiculopathy, lumbar region: Secondary | ICD-10-CM | POA: Diagnosis not present

## 2017-06-17 ENCOUNTER — Telehealth: Payer: Self-pay

## 2017-06-17 NOTE — Telephone Encounter (Signed)
[phone]  PATIENT NEEDS TO SCHEDULE TCS

## 2017-06-24 ENCOUNTER — Telehealth: Payer: Self-pay

## 2017-06-24 NOTE — Telephone Encounter (Signed)
Called home, many rings and no answer. Called mobile and [facility] for a return call. Urologist OK'd colonoscopy in April and pt just now calling back.

## 2017-06-24 NOTE — Telephone Encounter (Signed)
Pt called this morning saying that he had called a week ago and hasn't heard back from [place]. I told him the triage nurse is aware that he has called and she has right many triages and will be contacting him.

## 2017-06-24 NOTE — Telephone Encounter (Signed)
See previous note

## 2017-06-24 NOTE — Telephone Encounter (Signed)
See separate triage.  

## 2017-06-26 DIAGNOSIS — Z713 Dietary counseling and surveillance: Secondary | ICD-10-CM | POA: Diagnosis not present

## 2017-06-26 DIAGNOSIS — E7800 Pure hypercholesterolemia, unspecified: Secondary | ICD-10-CM | POA: Diagnosis not present

## 2017-06-26 DIAGNOSIS — Z79899 Other long term (current) drug therapy: Secondary | ICD-10-CM | POA: Diagnosis not present

## 2017-06-26 DIAGNOSIS — Z23 Encounter for immunization: Secondary | ICD-10-CM | POA: Diagnosis not present

## 2017-06-26 DIAGNOSIS — Z6830 Body mass index (BMI) 30.0-30.9, adult: Secondary | ICD-10-CM | POA: Diagnosis not present

## 2017-06-26 DIAGNOSIS — Z299 Encounter for prophylactic measures, unspecified: Secondary | ICD-10-CM | POA: Diagnosis not present

## 2017-06-27 NOTE — Telephone Encounter (Signed)
Gastroenterology Pre-Procedure Review  Request Date: 06/24/2017 Requesting Physician:   PATIENT REVIEW QUESTIONS: The patient responded to the following health history questions as indicated:    Pt saw Walden Field, NP on 12/13/2016. Had to get clearance from Urology and received that OK in April. Just now called back to schedule colonoscopy. Thought he had one 11 years ago at [facility], but Medical Records said nothing found.   1. Diabetes Melitis: no 2. Joint replacements in the past 12 months: no 3. Major health problems in the past 3 months: no 4. Has an artificial valve or MVP: no 5. Has a defibrillator: no 6. Has been advised in past to take antibiotics in advance of a procedure like teeth cleaning: no 7. Family history of colon cancer: no  8. Alcohol Use: no 9. History of sleep apnea: no  10. History of coronary artery or other vascular stents placed within the last 12 months: no 11. History of any prior anesthesia complications: no  Phenergan makes him sick on stomach    MEDICATIONS & ALLERGIES:    Patient reports the following regarding taking any blood thinners:   Plavix? no Aspirin? YES Coumadin? no Brilinta? no  Xarelto? no Eliquis? no Pradaxa? no Savaysa? no Effient? no  Patient confirms/reports the following medications:  Current Outpatient Prescriptions  Medication Sig Dispense Refill  . acetaminophen (TYLENOL) 325 MG tablet Take 650 mg by mouth every 6 (six) hours as needed.    Marland Kitchen aspirin EC 81 MG tablet Take 81 mg by mouth 2 (two) times daily.    Marland Kitchen atorvastatin (LIPITOR) 10 MG tablet Take 5 mg by mouth at bedtime.   0  . diclofenac (VOLTAREN) 75 MG EC tablet Take 75 mg by mouth daily.    . diclofenac sodium (VOLTAREN) 1 % GEL Apply 2 g topically at bedtime as needed (for back pain.).     Marland Kitchen diphenhydrAMINE (BENADRYL) 25 mg capsule Take 25 mg by mouth every 6 (six) hours as needed (for runny nose/allergies.).    Marland Kitchen traMADol (ULTRAM) 50 MG tablet Take by mouth every 6  (six) hours as needed. Takes one tablet about twice weekly for back pain     No current facility-administered medications for this visit.     Patient confirms/reports the following allergies:  Allergies  Allergen Reactions  . Ciprofloxacin Diarrhea  . Codeine Nausea And Vomiting    No orders of the defined types were placed in this encounter.   AUTHORIZATION INFORMATION Primary Insurance:   ID #:   Group #:  Pre-Cert / Auth required:  Pre-Cert / Auth #:   Secondary Insurance:   ID #:   Group #:  Pre-Cert / Auth required:  Pre-Cert / Auth #:   SCHEDULE INFORMATION: Procedure has been scheduled as follows:  Date:                              Time:   Location:   This Gastroenterology Pre-Precedure Review Form is being routed to the following provider(s): R. Garfield Cornea, MD

## 2017-06-28 NOTE — Telephone Encounter (Signed)
Ok to schedule. Phenergan allergy already on the chart.

## 2017-07-11 ENCOUNTER — Other Ambulatory Visit: Payer: Self-pay

## 2017-07-11 DIAGNOSIS — Z1211 Encounter for screening for malignant neoplasm of colon: Secondary | ICD-10-CM

## 2017-07-11 MED ORDER — PEG 3350-KCL-NA BICARB-NACL 420 G PO SOLR: 4000.0000 mL | ORAL | 0 refills | Status: DC

## 2017-07-11 NOTE — Telephone Encounter (Signed)
Rx sent to the pharmacy and instructions mailed to pt.  

## 2017-07-11 NOTE — Telephone Encounter (Signed)
LMOM to call.

## 2017-07-11 NOTE — Telephone Encounter (Signed)
Pt has been scheduled for 08/15/2017 at 11:45 am with Dr. Gala Romney.

## 2017-08-15 ENCOUNTER — Encounter (HOSPITAL_COMMUNITY)
Admission: RE | Disposition: A | Discharge status: Home / Self Care | Payer: Self-pay | Source: Ambulatory Visit | Attending: Internal Medicine

## 2017-08-15 ENCOUNTER — Encounter (HOSPITAL_COMMUNITY): Payer: Self-pay | Admitting: *Deleted

## 2017-08-15 ENCOUNTER — Other Ambulatory Visit: Payer: Self-pay

## 2017-08-15 ENCOUNTER — Ambulatory Visit (HOSPITAL_COMMUNITY)
Admission: RE | Admit: 2017-08-15 | Discharge: 2017-08-15 | Disposition: A | Discharge status: Home / Self Care | Payer: Medicare Other | Source: Ambulatory Visit | Attending: Internal Medicine | Admitting: Internal Medicine

## 2017-08-15 DIAGNOSIS — Z7982 Long term (current) use of aspirin: Secondary | ICD-10-CM | POA: Insufficient documentation

## 2017-08-15 DIAGNOSIS — M549 Dorsalgia, unspecified: Secondary | ICD-10-CM | POA: Diagnosis not present

## 2017-08-15 DIAGNOSIS — K5730 Diverticulosis of large intestine without perforation or abscess without bleeding: Secondary | ICD-10-CM | POA: Diagnosis not present

## 2017-08-15 DIAGNOSIS — Z9109 Other allergy status, other than to drugs and biological substances: Secondary | ICD-10-CM | POA: Insufficient documentation

## 2017-08-15 DIAGNOSIS — Z800 Family history of malignant neoplasm of digestive organs: Secondary | ICD-10-CM | POA: Insufficient documentation

## 2017-08-15 DIAGNOSIS — Z87442 Personal history of urinary calculi: Secondary | ICD-10-CM | POA: Insufficient documentation

## 2017-08-15 DIAGNOSIS — H9190 Unspecified hearing loss, unspecified ear: Secondary | ICD-10-CM | POA: Diagnosis not present

## 2017-08-15 DIAGNOSIS — Z9079 Acquired absence of other genital organ(s): Secondary | ICD-10-CM | POA: Diagnosis not present

## 2017-08-15 DIAGNOSIS — K649 Unspecified hemorrhoids: Secondary | ICD-10-CM | POA: Diagnosis not present

## 2017-08-15 DIAGNOSIS — K529 Noninfective gastroenteritis and colitis, unspecified: Secondary | ICD-10-CM | POA: Diagnosis not present

## 2017-08-15 DIAGNOSIS — E785 Hyperlipidemia, unspecified: Secondary | ICD-10-CM | POA: Insufficient documentation

## 2017-08-15 DIAGNOSIS — Z1211 Encounter for screening for malignant neoplasm of colon: Secondary | ICD-10-CM | POA: Diagnosis not present

## 2017-08-15 DIAGNOSIS — M1990 Unspecified osteoarthritis, unspecified site: Secondary | ICD-10-CM | POA: Insufficient documentation

## 2017-08-15 DIAGNOSIS — Z881 Allergy status to other antibiotic agents status: Secondary | ICD-10-CM | POA: Diagnosis not present

## 2017-08-15 DIAGNOSIS — Z8546 Personal history of malignant neoplasm of prostate: Secondary | ICD-10-CM | POA: Insufficient documentation

## 2017-08-15 DIAGNOSIS — Z79899 Other long term (current) drug therapy: Secondary | ICD-10-CM | POA: Insufficient documentation

## 2017-08-15 DIAGNOSIS — Z885 Allergy status to narcotic agent status: Secondary | ICD-10-CM | POA: Insufficient documentation

## 2017-08-15 DIAGNOSIS — Z86718 Personal history of other venous thrombosis and embolism: Secondary | ICD-10-CM | POA: Insufficient documentation

## 2017-08-15 HISTORY — PX: COLONOSCOPY: SHX5424

## 2017-08-15 HISTORY — PX: BIOPSY: SHX5522

## 2017-08-15 SURGERY — COLONOSCOPY
Anesthesia: Moderate Sedation

## 2017-08-15 MED ORDER — MIDAZOLAM HCL 5 MG/5ML IJ SOLN
INTRAMUSCULAR | Status: DC | PRN
  Administered 2017-08-15: 2 mg via INTRAVENOUS
  Administered 2017-08-15: 1 mg via INTRAVENOUS

## 2017-08-15 MED ORDER — PROMETHAZINE HCL 25 MG/ML IJ SOLN
INTRAMUSCULAR | Status: AC
  Filled 2017-08-15: qty 1

## 2017-08-15 MED ORDER — MEPERIDINE HCL 100 MG/ML IJ SOLN
INTRAMUSCULAR | Status: DC | PRN
  Administered 2017-08-15: 50 mg via INTRAVENOUS
  Administered 2017-08-15: 25 mg via INTRAVENOUS

## 2017-08-15 MED ORDER — ONDANSETRON HCL 4 MG/2ML IJ SOLN
INTRAMUSCULAR | Status: DC | PRN
Start: 2017-08-15 — End: 2017-08-15
  Administered 2017-08-15: 4 mg via INTRAVENOUS

## 2017-08-15 MED ORDER — PROMETHAZINE HCL 25 MG/ML IJ SOLN
12.5000 mg | Freq: Once | INTRAMUSCULAR | Status: AC
  Administered 2017-08-15: 12.5 mg via INTRAVENOUS

## 2017-08-15 MED ORDER — ONDANSETRON HCL 4 MG/2ML IJ SOLN
INTRAMUSCULAR | Status: AC
  Filled 2017-08-15: qty 2

## 2017-08-15 MED ORDER — MEPERIDINE HCL 100 MG/ML IJ SOLN
INTRAMUSCULAR | Status: AC
  Filled 2017-08-15: qty 2

## 2017-08-15 MED ORDER — STERILE WATER FOR IRRIGATION IR SOLN
Status: DC | PRN
  Administered 2017-08-15: 11:00:00

## 2017-08-15 MED ORDER — SODIUM CHLORIDE 0.9% FLUSH
INTRAVENOUS | Status: AC
  Filled 2017-08-15: qty 10

## 2017-08-15 MED ORDER — SODIUM CHLORIDE 0.9 % IV SOLN
INTRAVENOUS | Status: DC
  Administered 2017-08-15: 10:00:00 via INTRAVENOUS

## 2017-08-15 MED ORDER — MIDAZOLAM HCL 5 MG/5ML IJ SOLN
INTRAMUSCULAR | Status: AC
  Filled 2017-08-15: qty 10

## 2017-08-15 NOTE — Discharge Instructions (Signed)
Diverticulosis Diverticulosis is a condition that develops when small pouches (diverticula) form in the wall of the large intestine (colon). The colon is where water is absorbed and stool is formed. The pouches form when the inside layer of the colon pushes through weak spots in the outer layers of the colon. You may have a few pouches or many of them. What are the causes? The cause of this condition is not known. What increases the risk? The following factors may make you more likely to develop this condition:  Being older than age 65. Your risk for this condition increases with age. Diverticulosis is rare among people younger than age 65. By age 65, many people have it.  Eating a low-fiber diet.  Having frequent constipation.  Being overweight.  Not getting enough exercise.  Smoking.  Taking over-the-counter pain medicines, like aspirin and ibuprofen.  Having a family history of diverticulosis.  What are the signs or symptoms? In most people, there are no symptoms of this condition. If you do have symptoms, they may include:  Bloating.  Cramps in the abdomen.  Constipation or diarrhea.  Pain in the lower left side of the abdomen.  How is this diagnosed? This condition is most often diagnosed during an exam for other colon problems. Because diverticulosis usually has no symptoms, it often cannot be diagnosed independently. This condition may be diagnosed by:  Using a flexible scope to examine the colon (colonoscopy).  Taking an X-ray of the colon after dye has been put into the colon (barium enema).  Doing a CT scan.  How is this treated? You may not need treatment for this condition if you have never developed an infection related to diverticulosis. If you have had an infection before, treatment may include:  Eating a high-fiber diet. This may include eating more fruits, vegetables, and grains.  Taking a fiber supplement.  Taking a live bacteria supplement  (probiotic).  Taking medicine to relax your colon.  Taking antibiotic medicines.  Follow these instructions at home:  Drink 6-8 glasses of water or more each day to prevent constipation.  Try not to strain when you have a bowel movement.  If you have had an infection before: ? Eat more fiber as directed by your health care provider or your diet and nutrition specialist (dietitian). ? Take a fiber supplement or probiotic, if your health care provider approves.  Take over-the-counter and prescription medicines only as told by your health care provider.  If you were prescribed an antibiotic, take it as told by your health care provider. Do not stop taking the antibiotic even if you start to feel better.  Keep all follow-up visits as told by your health care provider. This is important. Contact a health care provider if:  You have pain in your abdomen.  You have bloating.  You have cramps.  You have not had a bowel movement in 3 days. Get help right away if:  Your pain gets worse.  Your bloating becomes very bad.  You have a fever or chills, and your symptoms suddenly get worse.  You vomit.  You have bowel movements that are bloody or black.  You have bleeding from your rectum. Summary  Diverticulosis is a condition that develops when small pouches (diverticula) form in the wall of the large intestine (colon).  You may have a few pouches or many of them.  This condition is most often diagnosed during an exam for other colon problems.  If you have had an  infection related to diverticulosis, treatment may include increasing the fiber in your diet, taking supplements, or taking medicines. This information is not intended to replace advice given to you by your health care provider. Make sure you discuss any questions you have with your health care provider. Document Released: 05/24/2004 Document Revised: 07/16/2016 Document Reviewed: 07/16/2016 Elsevier Interactive  Patient Education  2017 [place].  Colonoscopy Discharge Instructions  Read the instructions outlined below and refer to this sheet in the next few weeks. These discharge instructions provide you with general information on caring for yourself after you leave the hospital. Your doctor may also give you specific instructions. While your treatment has been planned according to the most current medical practices available, unavoidable complications occasionally occur. If you have any problems or questions after discharge, call Dr. Gala Romney at [phone]. ACTIVITY  You may resume your regular activity, but move at a slower pace for the next 24 hours.   Take frequent rest periods for the next 24 hours.   Walking will help get rid of the air and reduce the bloated feeling in your belly (abdomen).   No driving for 24 hours (because of the medicine (anesthesia) used during the test).    Do not sign any important legal documents or operate any machinery for 24 hours (because of the anesthesia used during the test).  NUTRITION  Drink plenty of fluids.   You may resume your normal diet as instructed by your doctor.   Begin with a light meal and progress to your normal diet. Heavy or fried foods are harder to digest and may make you feel sick to your stomach (nauseated).   Avoid alcoholic beverages for 24 hours or as instructed.  MEDICATIONS  You may resume your normal medications unless your doctor tells you otherwise.  WHAT YOU CAN EXPECT TODAY  Some feelings of bloating in the abdomen.   Passage of more gas than usual.   Spotting of blood in your stool or on the toilet paper.  IF YOU HAD POLYPS REMOVED DURING THE COLONOSCOPY:  No aspirin products for 7 days or as instructed.   No alcohol for 7 days or as instructed.   Eat a soft diet for the next 24 hours.  FINDING OUT THE RESULTS OF YOUR TEST Not all test results are available during your visit. If your test results are not back  during the visit, make an appointment with your caregiver to find out the results. Do not assume everything is normal if you have not heard from your caregiver or the medical facility. It is important for you to follow up on all of your test results.  SEEK IMMEDIATE MEDICAL ATTENTION IF:  You have more than a spotting of blood in your stool.   Your belly is swollen (abdominal distention).   You are nauseated or vomiting.   You have a temperature over 101.   You have abdominal pain or discomfort that is severe or gets worse throughout the day.    Diverticulosis information provided  Further recommendations to follow pending review of pathology report

## 2017-08-15 NOTE — Op Note (Addendum)
[facility] Patient Name: Albert Graham Procedure Date: 08/15/2017 10:08 AM MRN: [id number] Date of Birth: 05/17/1951 Attending MD: Norvel Richards , MD CSN: [id number] Age: 66 Admit Type: Outpatient Procedure:                Colonoscopy Indications:              Screening for colorectal malignant neoplasm Providers:                Norvel Richards, MD, Jeanann Lewandowsky. Sharon Seller, RN,                            Nelma Rothman, Technician, Aram Candela Referring MD:              Medicines:                Midazolam 3 mg IV, Meperidine 75 mg IV, Complications:            No immediate complications. Estimated Blood Loss:     Estimated blood loss was minimal. Procedure:                Pre-Anesthesia Assessment:                           - Prior to the procedure, a History and Physical                            was performed, and patient medications and                            allergies were reviewed. The patient's tolerance of                            previous anesthesia was also reviewed. The risks                            and benefits of the procedure and the sedation                            options and risks were discussed with the patient.                            All questions were answered, and informed consent                            was obtained. Prior Anticoagulants: The patient has                            taken no previous anticoagulant or antiplatelet                            agents. ASA Grade Assessment: II - A patient with                            mild systemic disease. After reviewing the risks  and benefits, the patient was deemed in                            satisfactory condition to undergo the procedure.                           After obtaining informed consent, the colonoscope                            was passed under direct vision. Throughout the                            procedure, the patient's blood pressure, pulse,  and                            oxygen saturations were monitored continuously. The                            EC-[id number] ([id number]) scope was introduced through                            the anus and advanced to the the cecum, identified                            by appendiceal orifice and ileocecal valve. The                            colonoscopy was performed without difficulty. The                            patient tolerated the procedure well. The quality                            of the bowel preparation was adequate. The                            ileocecal valve, appendiceal orifice, and rectum                            were photographed. The entire colon was well                            visualized. Scope In: 10:34:42 AM Scope Out: 10:50:07 AM Scope Withdrawal Time: 0 hours 11 minutes 12 seconds  Total Procedure Duration: 0 hours 15 minutes 25 seconds  Findings:      The perianal and digital rectal examinations were normal. Focal area of       hyperemia, thromboticand multiple small "volcano-like" areas of       inflammation throughout a segment of sigmoid colon; no polyp or       neoplastic appearing process een anywhere in the colon or rectum.      Scattered small and large-mouthed diverticula were found in the sigmoid       colon and descending colon. Biopsies of the abnormal sigmoid segment  taken.      The exam was otherwise without abnormality on direct and retroflexion       views. Impression:               - Diverticulosis in the sigmoid colon and in the                            descending colon.                           - The examination was otherwise normal on direct                            and retroflexion views.                           - Moderate Sedation:      Moderate (conscious) sedation was administered by the endoscopy nurse       and supervised by the endoscopist. The following parameters were       monitored: oxygen saturation, heart  rate, blood pressure, respiratory       rate, EKG, adequacy of pulmonary ventilation, and response to care.       Total physician intraservice time was 19 minutes. Recommendation:           - Patient has a contact number available for                            emergencies. The signs and symptoms of potential                            delayed complications were discussed with the                            patient. Return to normal activities tomorrow.                            Written discharge instructions were provided to the                            patient.                           - Resume previous diet.                           - Continue present medications.                           - Repeat colonoscopy date to be determined after                            pending pathology results are reviewed for                            surveillance based on pathology results.                           -  Return to GI clinic (date not yet determined). Procedure Code(s):        --- Professional ---                           [id number], Colonoscopy, flexible; diagnostic, including                            collection of specimen(s) by brushing or washing,                            when performed (separate procedure)                           99152, Moderate sedation services provided by the                            same physician or other qualified health care                            professional performing the diagnostic or                            therapeutic service that the sedation supports,                            requiring the presence of an independent trained                            observer to assist in the monitoring of the                            patient's level of consciousness and physiological                            status; initial 15 minutes of intraservice time,                            patient age 52 years or older Diagnosis Code(s):        ---  Professional ---                           Z12.11, Encounter for screening for malignant                            neoplasm of colon                           K57.30, Diverticulosis of large intestine without                            perforation or abscess without bleeding CPT copyright 2016 American Medical Association. All rights reserved. The codes documented in this report are preliminary and upon coder review may  be revised to meet current compliance requirements. Cristopher Estimable. Zayonna Ayuso, MD Norvel Richards, MD 08/15/2017 11:03:33 AM This report has  been signed electronically. Number of Addenda: 1 Addendum Number: 1   Addendum Date: 08/19/2017 11:27:51 AM      Under impression: In addition to diverticulosis, focal inflammatory       changes in L colon of uncertain sognificance; s/p biopsy Cristopher Estimable. Darell Saputo, MD Norvel Richards, MD 08/19/2017 11:29:12 AM This report has been signed electronically.

## 2017-08-15 NOTE — H&P (Signed)
@LOGO @   Primary Care Physician:  Glenda Chroman, MD Primary Gastroenterologist:  Dr. Gala Romney  Pre-Procedure History & Physical: HPI:  Albert Graham is a 66 y.o. male is here for a screening colonoscopy.   No bowel symptoms. No family history of colon cancer. Patient reports no colonoscopy here 10 years ago but records cannot be located.  Past Medical History:  Diagnosis Date  . Arthritis   . Back pain   . Blood clot associated with vein wall inflammation 20 yrs ago   left leg   . Cancer [facility])    prostate   . Dyslipidemia   . Hemorrhoids   . HOH (hard of hearing)    both ears  . Joint pain   . Kidney stone   . PONV (postoperative nausea and vomiting)    likes phenergan    Past Surgical History:  Procedure Laterality Date  . BACK SURGERY  12/31/2013   lower  . foot surgery for arthritis Bilateral    left leg tendon cut also  . LYMPHADENECTOMY Bilateral 10/18/2016   Procedure: PELVIC LYMPHADENECTOMY;  Surgeon: Raynelle Bring, MD;  Location: [facility];  Service: Urology;  Laterality: Bilateral;  . ROBOT ASSISTED LAPAROSCOPIC RADICAL PROSTATECTOMY N/A 10/18/2016   Procedure: XI ROBOTIC ASSISTED LAPAROSCOPIC RADICAL PROSTATECTOMY LEVEL 2;  Surgeon: Raynelle Bring, MD;  Location: [facility];  Service: Urology;  Laterality: N/A;    Prior to Admission medications   Medication Sig Start Date End Date Taking? Authorizing Provider  acetaminophen (TYLENOL) 325 MG tablet Take 650 mg by mouth every 6 (six) hours as needed.   Yes [provider]  atorvastatin (LIPITOR) 10 MG tablet Take 5 mg by mouth at bedtime.  06/26/16  Yes [provider]  diclofenac (VOLTAREN) 75 MG EC tablet Take 75 mg by mouth daily.   Yes [provider]  diclofenac sodium (VOLTAREN) 1 % GEL Apply 2 g topically at bedtime as needed (for back pain.).    Yes [provider]  diphenhydrAMINE (BENADRYL) 25 mg capsule Take 25 mg by mouth every 6 (six) hours as needed (for runny nose/allergies.).    Yes [provider]  traMADol (ULTRAM) 50 MG tablet Take by mouth every 6 (six) hours as needed. Takes one tablet about twice weekly for back pain   Yes [provider]  aspirin EC 81 MG tablet Take 81 mg by mouth daily.     [provider]    Allergies as of 07/11/2017 - Review Complete 06/24/2017  Allergen Reaction Noted  . Phenergan [promethazine hcl] Nausea Only 06/27/2017  . Ciprofloxacin Diarrhea 10/20/2016  . Codeine Nausea And Vomiting 08/13/2016    Family History  Problem Relation Age of Onset  . Stomach cancer Father   . Colon cancer Neg Hx     Social History   Socioeconomic History  . Marital status: Married    Spouse name: Not on file  . Number of children: Not on file  . Years of education: Not on file  . Highest education level: Not on file  Social Needs  . Financial resource strain: Not on file  . Food insecurity - worry: Not on file  . Food insecurity - inability: Not on file  . Transportation needs - medical: Not on file  . Transportation needs - non-medical: Not on file  Occupational History  . Not on file  Tobacco Use  . Smoking status: Never Smoker  . Smokeless tobacco: Never Used  Substance and Sexual Activity  .  Alcohol use: No  . Drug use: No  . Sexual activity: Not on file  Other Topics Concern  . Not on file  Social History Narrative  . Not on file    Review of Systems: See HPI, otherwise negative ROS  Physical Exam: BP (!) 142/91   Pulse (!) 110   Temp 99.2 F (37.3 C) (Oral)   Resp 18   Ht 5\' 7"  (1.702 m)   Wt 192 lb (87.1 kg)   SpO2 96%   BMI 30.07 kg/m  General:   Alert,  Well-developed, well-nourished, pleasant and cooperative in NAD Lungs:  Clear throughout to auscultation.   No wheezes, crackles, or rhonchi. No acute distress. Heart:  Regular rate and rhythm; no murmurs, clicks, rubs,  or gallops. Abdomen:  Soft, nontender and nondistended. No masses, hepatosplenomegaly or hernias noted. Normal  bowel sounds, without guarding, and without rebound.   Impression/Plan: Albert Graham is now here to undergo a screening colonoscopy.  Average risk screening examination. Risks, benefits, limitations, imponderables and alternatives regarding colonoscopy have been reviewed with the patient. Questions have been answered. All parties agreeable.                           Notice:  This dictation was prepared with Dragon dictation along with smaller phrase technology. Any transcriptional errors that result from this process are unintentional and may not be corrected upon review.

## 2017-08-19 ENCOUNTER — Encounter: Payer: Self-pay | Admitting: Internal Medicine

## 2017-08-20 ENCOUNTER — Encounter (HOSPITAL_COMMUNITY): Payer: Self-pay | Admitting: Internal Medicine

## 2017-08-30 DIAGNOSIS — M47816 Spondylosis without myelopathy or radiculopathy, lumbar region: Secondary | ICD-10-CM | POA: Diagnosis not present

## 2017-09-10 HISTORY — PX: BACK SURGERY: SHX140

## 2017-09-11 DIAGNOSIS — C61 Malignant neoplasm of prostate: Secondary | ICD-10-CM | POA: Diagnosis not present

## 2017-09-11 DIAGNOSIS — N5201 Erectile dysfunction due to arterial insufficiency: Secondary | ICD-10-CM | POA: Diagnosis not present

## 2017-11-27 DIAGNOSIS — M47816 Spondylosis without myelopathy or radiculopathy, lumbar region: Secondary | ICD-10-CM | POA: Diagnosis not present

## 2018-01-01 IMAGING — CR DG CHEST 2V
2 series · 2 of 2 positions shown · non-contrast
Comparison: February 08, 2011

CLINICAL DATA: Preoperative prostatectomy.  Prostate carcinoma.

EXAM:
CHEST  2 VIEW

[w chest pa]
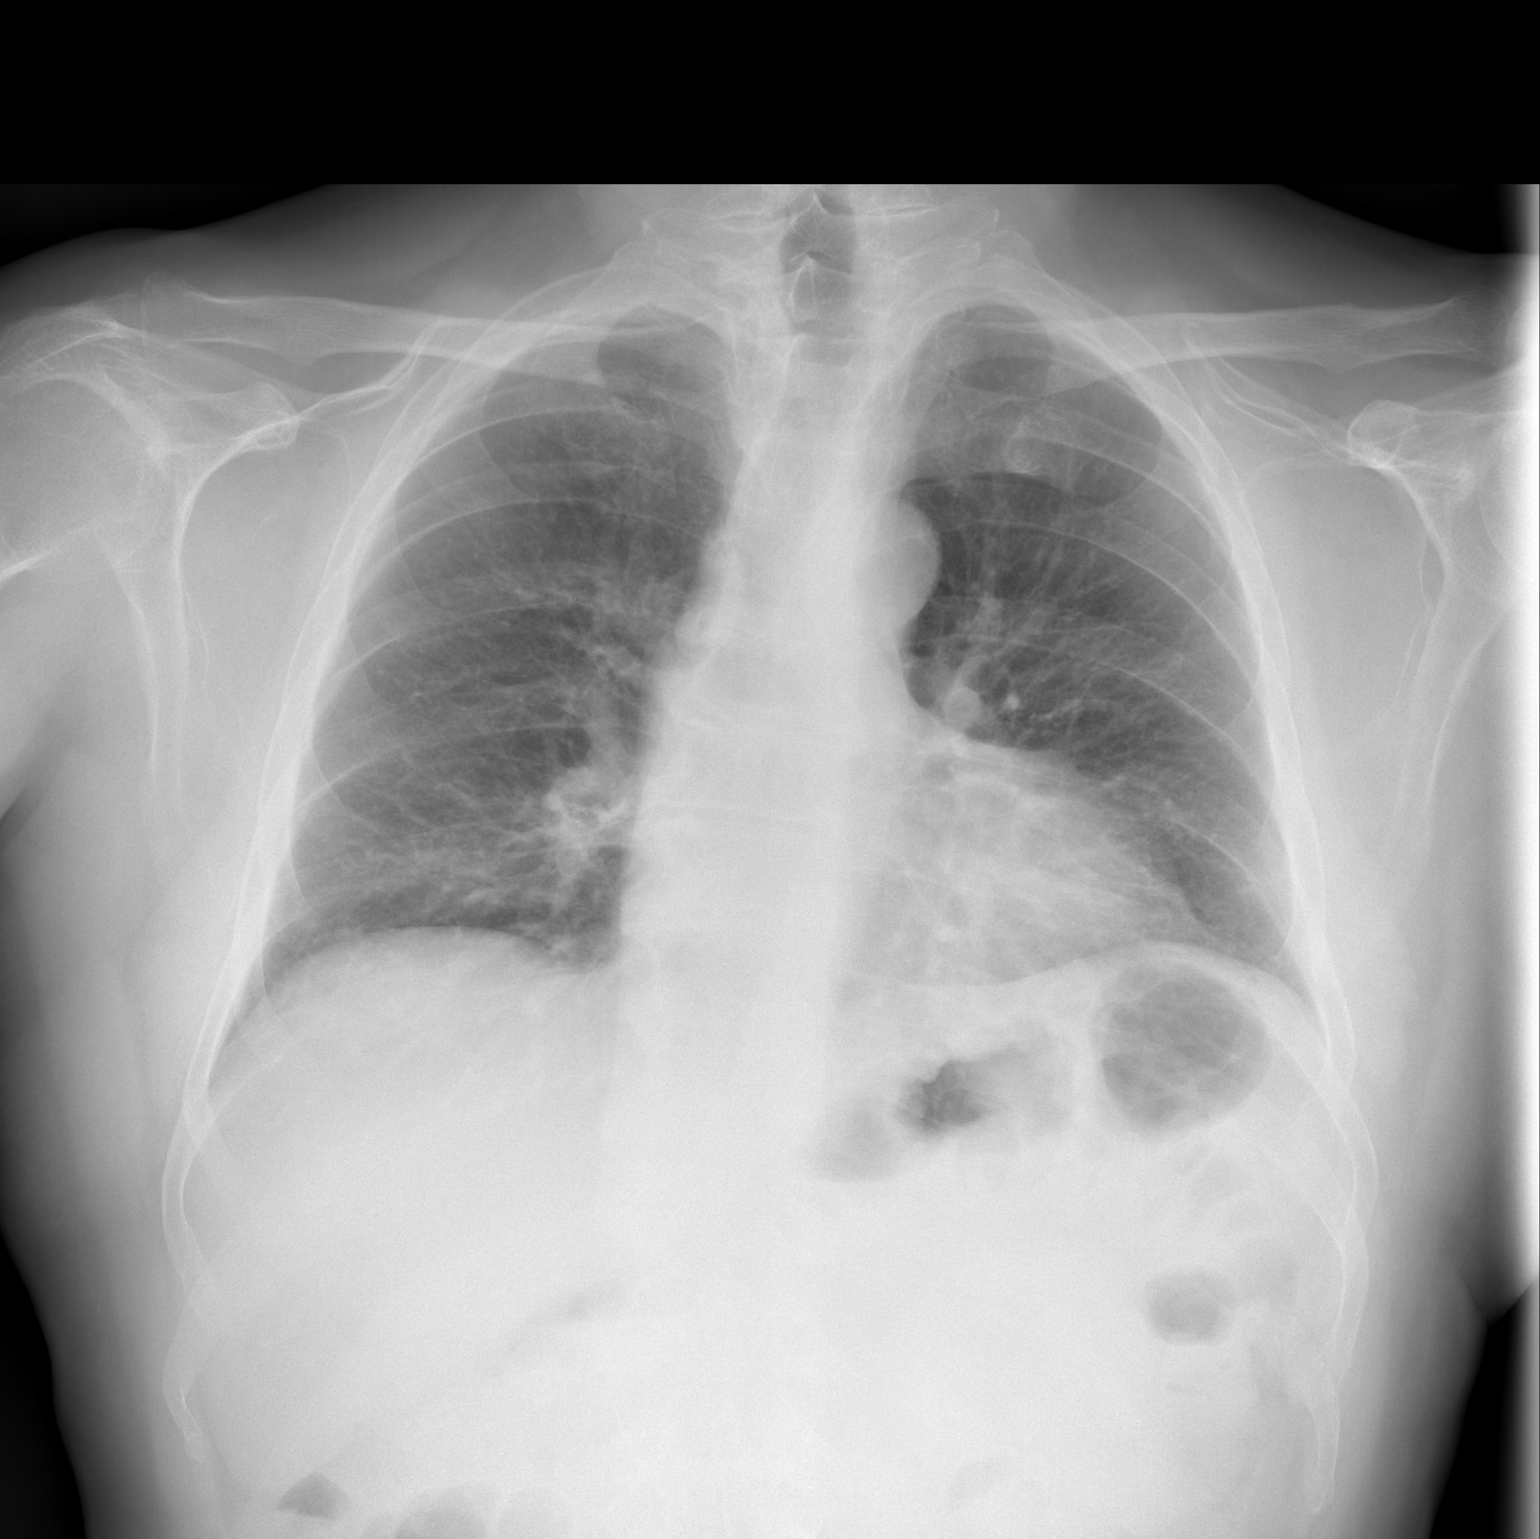

[w chest lat]
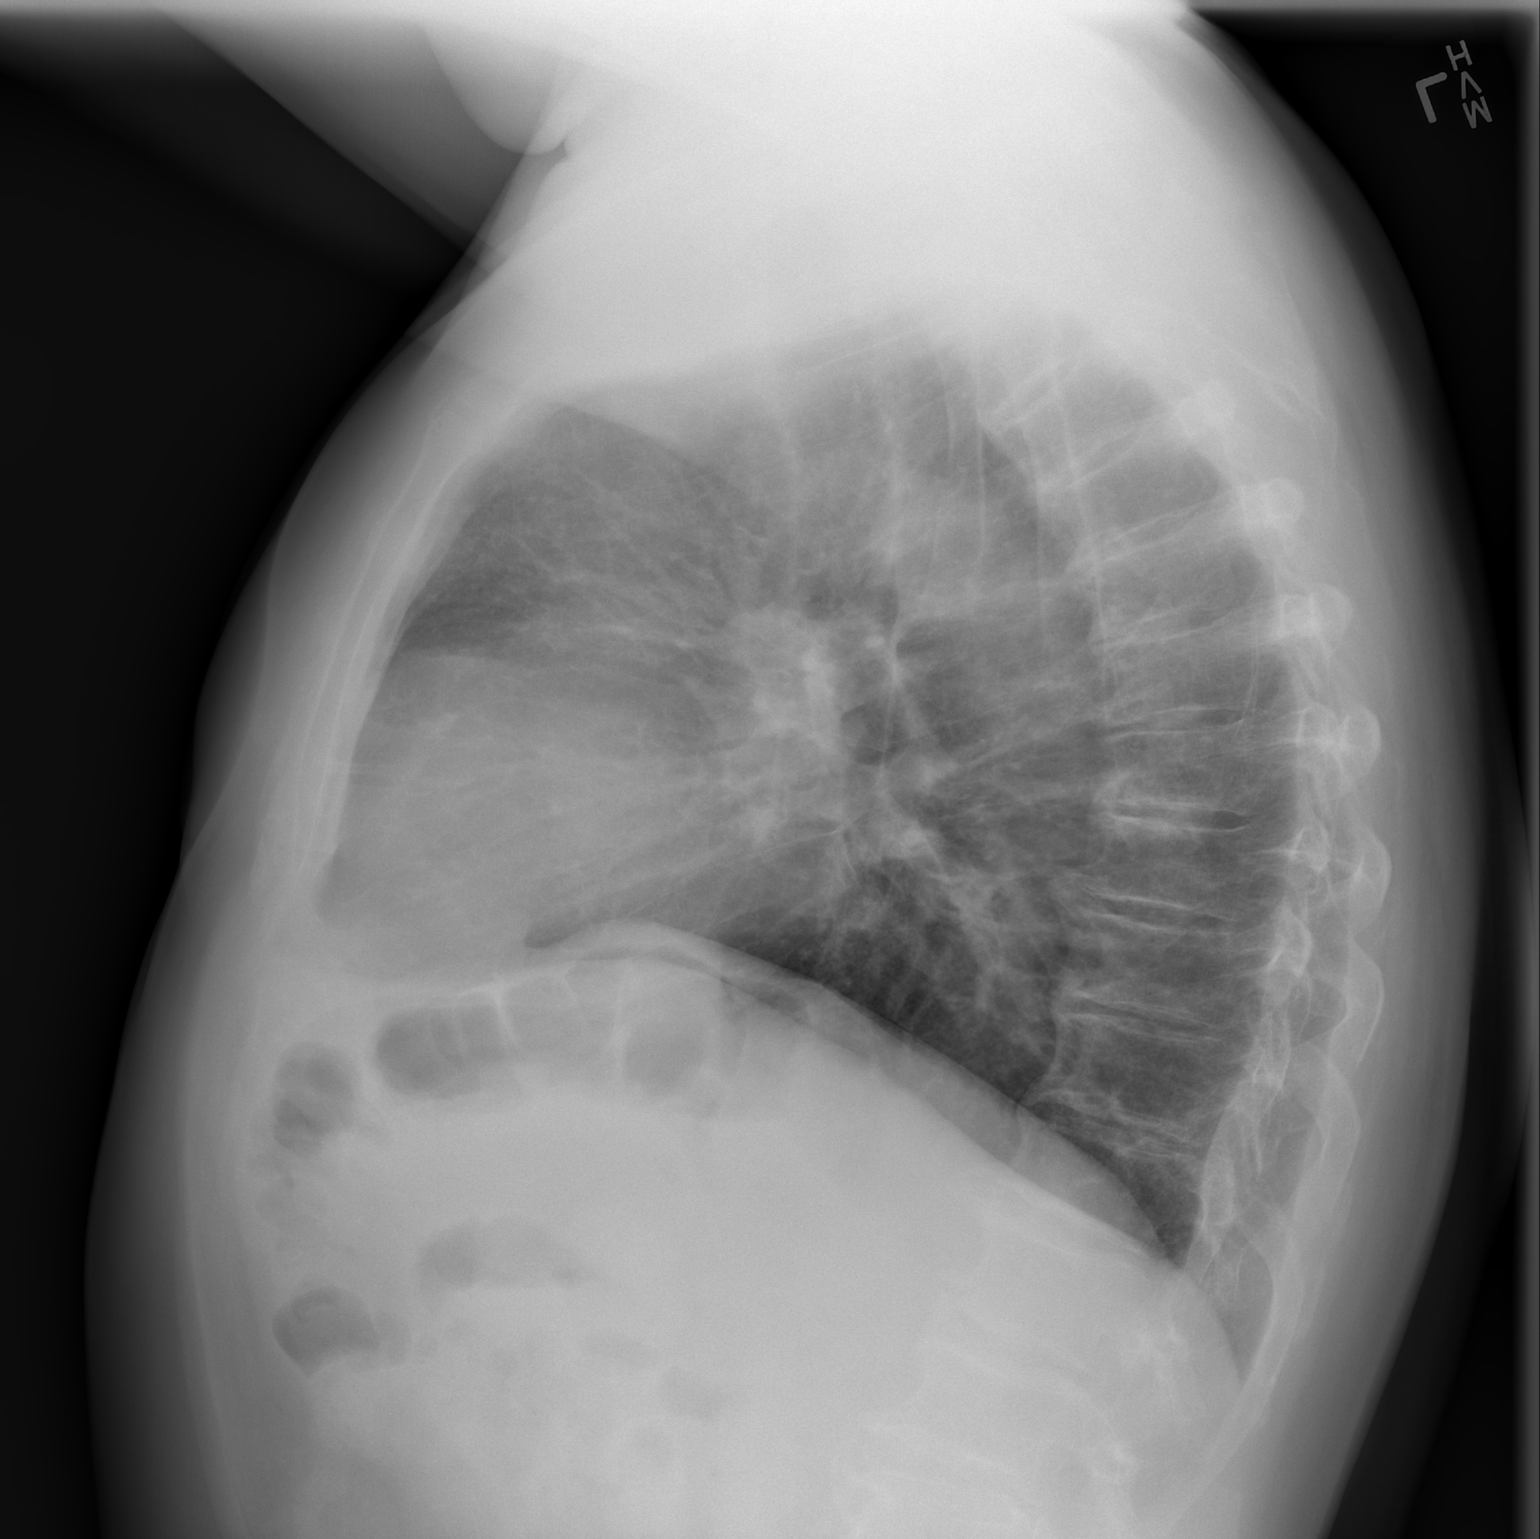

[2 of 2 positions shown; findings below may reference images not displayed]

FINDINGS: Lungs are clear. Heart size and pulmonary vascularity are normal. No
adenopathy. There is degenerative change in thoracic spine. There
are no evident blastic or lytic bone lesions.
IMPRESSION: No edema or consolidation.  No neoplastic focus evident.

## 2018-01-05 IMAGING — CR DG CHEST 2V
2 series · 2 of 2 positions shown · non-contrast
Comparison: Chest radiograph performed 10/16/2016

CLINICAL DATA: Acute onset of fever.

EXAM:
CHEST  2 VIEW

[w chest lat]
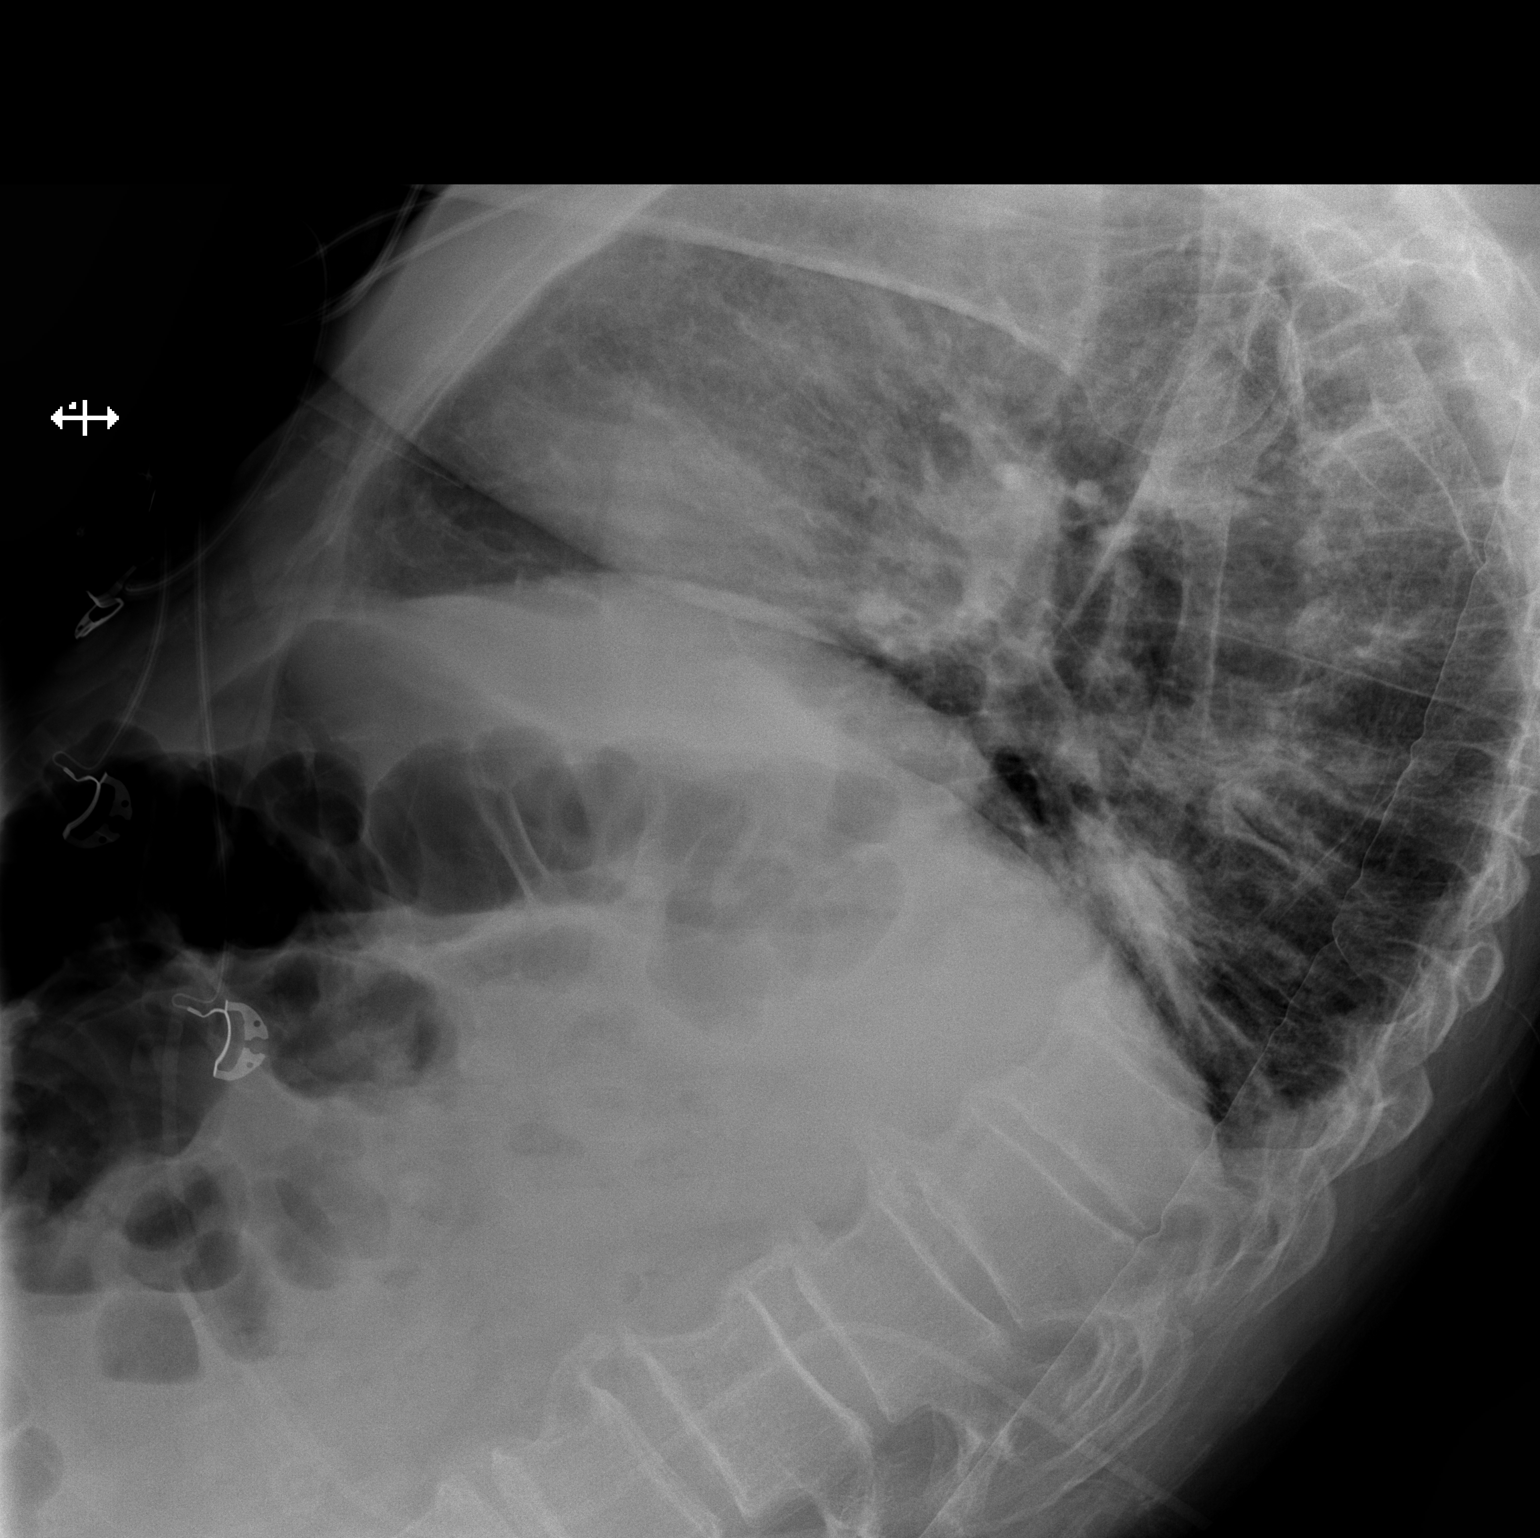

[x chest ap]
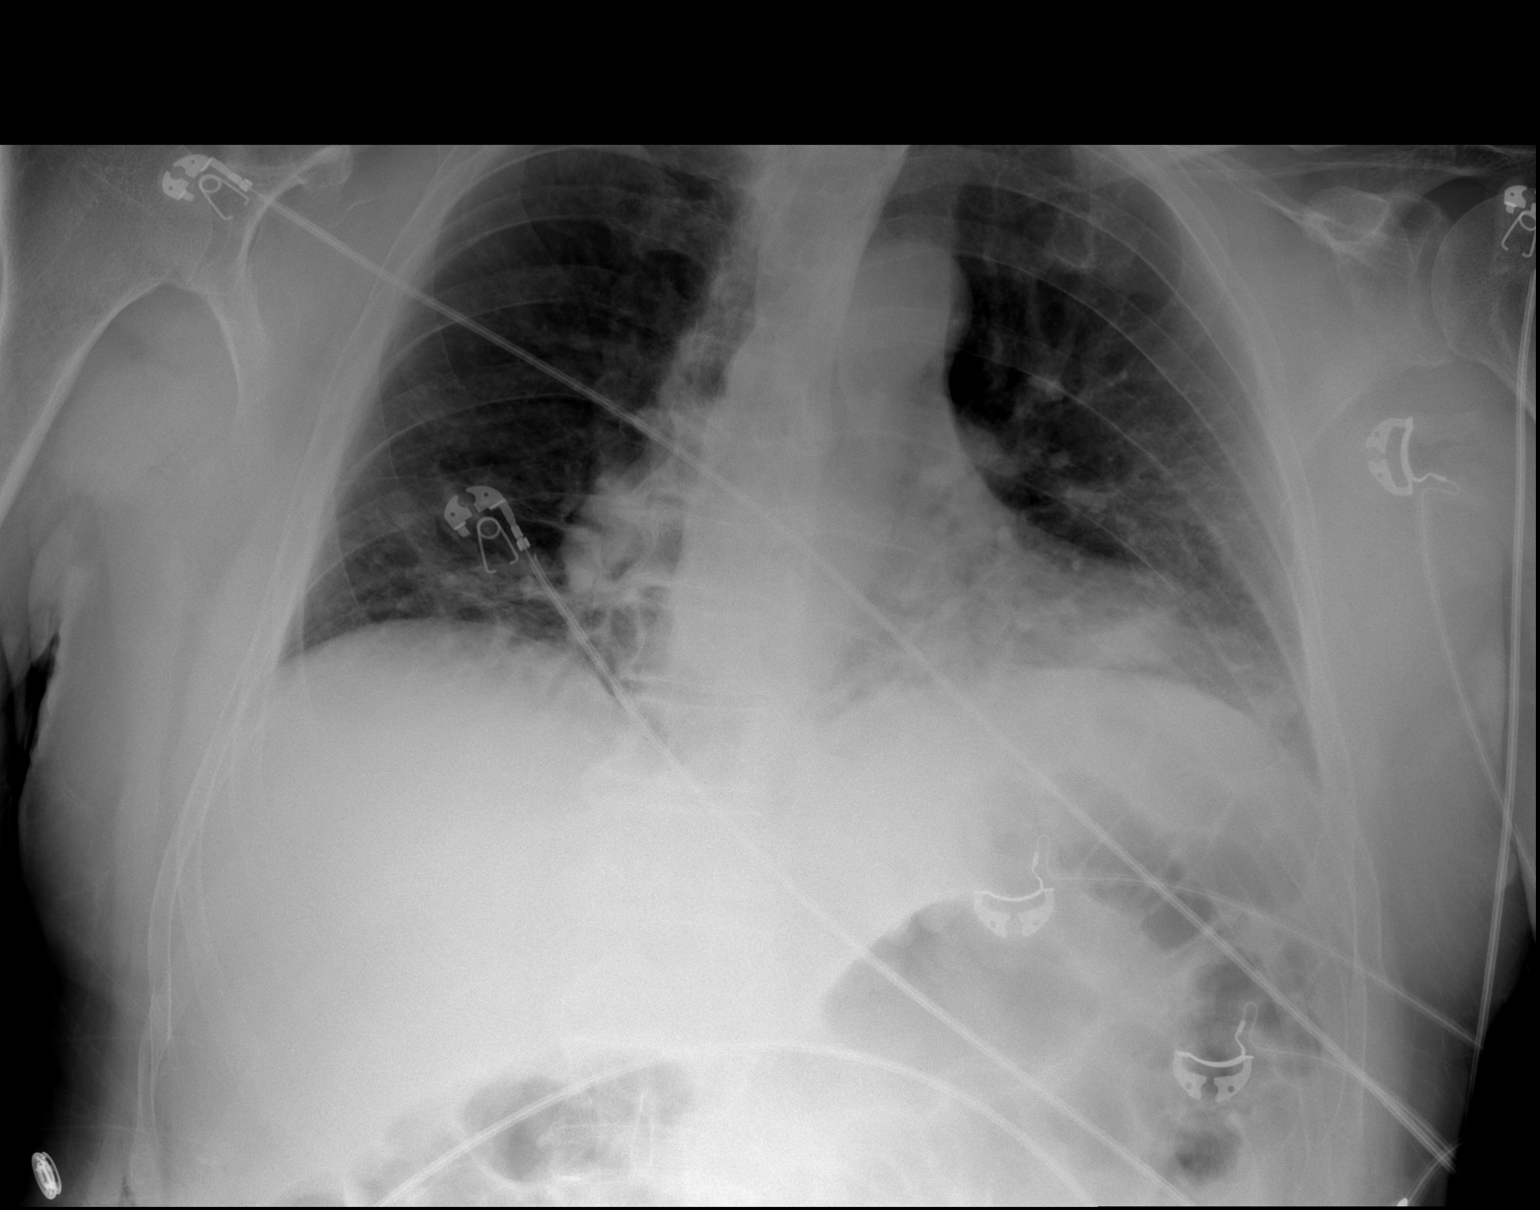

[2 of 2 positions shown; findings below may reference images not displayed]

FINDINGS: The lungs are mildly hypoexpanded. Left lower lobe airspace opacity
raises concern for pneumonia. No pleural effusion or pneumothorax is
seen.

The cardiomediastinal silhouette is normal in size. No acute osseous
abnormalities are identified.
IMPRESSION: Lungs mildly hypoexpanded. Left lower lobe airspace opacity raises
concern for pneumonia.

## 2018-01-28 DIAGNOSIS — Z7189 Other specified counseling: Secondary | ICD-10-CM | POA: Diagnosis not present

## 2018-01-28 DIAGNOSIS — Z125 Encounter for screening for malignant neoplasm of prostate: Secondary | ICD-10-CM | POA: Diagnosis not present

## 2018-01-28 DIAGNOSIS — E7800 Pure hypercholesterolemia, unspecified: Secondary | ICD-10-CM | POA: Diagnosis not present

## 2018-01-28 DIAGNOSIS — Z6830 Body mass index (BMI) 30.0-30.9, adult: Secondary | ICD-10-CM | POA: Diagnosis not present

## 2018-01-28 DIAGNOSIS — Z1331 Encounter for screening for depression: Secondary | ICD-10-CM | POA: Diagnosis not present

## 2018-01-28 DIAGNOSIS — R5383 Other fatigue: Secondary | ICD-10-CM | POA: Diagnosis not present

## 2018-01-28 DIAGNOSIS — Z1211 Encounter for screening for malignant neoplasm of colon: Secondary | ICD-10-CM | POA: Diagnosis not present

## 2018-01-28 DIAGNOSIS — Z1339 Encounter for screening examination for other mental health and behavioral disorders: Secondary | ICD-10-CM | POA: Diagnosis not present

## 2018-01-28 DIAGNOSIS — Z299 Encounter for prophylactic measures, unspecified: Secondary | ICD-10-CM | POA: Diagnosis not present

## 2018-01-28 DIAGNOSIS — Z87891 Personal history of nicotine dependence: Secondary | ICD-10-CM | POA: Diagnosis not present

## 2018-01-28 DIAGNOSIS — Z0000 Encounter for general adult medical examination without abnormal findings: Secondary | ICD-10-CM | POA: Diagnosis not present

## 2018-01-28 DIAGNOSIS — Z79899 Other long term (current) drug therapy: Secondary | ICD-10-CM | POA: Diagnosis not present

## 2018-01-28 DIAGNOSIS — B360 Pityriasis versicolor: Secondary | ICD-10-CM | POA: Diagnosis not present

## 2018-01-29 DIAGNOSIS — M47816 Spondylosis without myelopathy or radiculopathy, lumbar region: Secondary | ICD-10-CM | POA: Diagnosis not present

## 2018-01-29 DIAGNOSIS — M48062 Spinal stenosis, lumbar region with neurogenic claudication: Secondary | ICD-10-CM | POA: Diagnosis not present

## 2018-02-04 DIAGNOSIS — M4807 Spinal stenosis, lumbosacral region: Secondary | ICD-10-CM | POA: Diagnosis not present

## 2018-02-04 DIAGNOSIS — M5136 Other intervertebral disc degeneration, lumbar region: Secondary | ICD-10-CM | POA: Diagnosis not present

## 2018-02-04 DIAGNOSIS — M48061 Spinal stenosis, lumbar region without neurogenic claudication: Secondary | ICD-10-CM | POA: Diagnosis not present

## 2018-02-04 DIAGNOSIS — M4316 Spondylolisthesis, lumbar region: Secondary | ICD-10-CM | POA: Diagnosis not present

## 2018-02-04 DIAGNOSIS — M545 Low back pain: Secondary | ICD-10-CM | POA: Diagnosis not present

## 2018-02-04 DIAGNOSIS — M47816 Spondylosis without myelopathy or radiculopathy, lumbar region: Secondary | ICD-10-CM | POA: Diagnosis not present

## 2018-02-07 DIAGNOSIS — M48062 Spinal stenosis, lumbar region with neurogenic claudication: Secondary | ICD-10-CM | POA: Diagnosis not present

## 2018-02-07 DIAGNOSIS — M4316 Spondylolisthesis, lumbar region: Secondary | ICD-10-CM | POA: Diagnosis not present

## 2018-02-07 DIAGNOSIS — M47816 Spondylosis without myelopathy or radiculopathy, lumbar region: Secondary | ICD-10-CM | POA: Diagnosis not present

## 2018-02-20 DIAGNOSIS — Z01818 Encounter for other preprocedural examination: Secondary | ICD-10-CM | POA: Diagnosis not present

## 2018-02-20 DIAGNOSIS — M47816 Spondylosis without myelopathy or radiculopathy, lumbar region: Secondary | ICD-10-CM | POA: Diagnosis not present

## 2018-02-24 DIAGNOSIS — Z86718 Personal history of other venous thrombosis and embolism: Secondary | ICD-10-CM | POA: Diagnosis not present

## 2018-02-24 DIAGNOSIS — Z87891 Personal history of nicotine dependence: Secondary | ICD-10-CM | POA: Diagnosis not present

## 2018-02-24 DIAGNOSIS — M4726 Other spondylosis with radiculopathy, lumbar region: Secondary | ICD-10-CM | POA: Diagnosis present

## 2018-02-24 DIAGNOSIS — E7800 Pure hypercholesterolemia, unspecified: Secondary | ICD-10-CM | POA: Diagnosis present

## 2018-02-24 DIAGNOSIS — M4316 Spondylolisthesis, lumbar region: Secondary | ICD-10-CM | POA: Diagnosis present

## 2018-02-24 DIAGNOSIS — Z885 Allergy status to narcotic agent status: Secondary | ICD-10-CM | POA: Diagnosis not present

## 2018-02-24 DIAGNOSIS — Z8546 Personal history of malignant neoplasm of prostate: Secondary | ICD-10-CM | POA: Diagnosis not present

## 2018-02-24 DIAGNOSIS — M48061 Spinal stenosis, lumbar region without neurogenic claudication: Secondary | ICD-10-CM | POA: Diagnosis present

## 2018-02-24 DIAGNOSIS — T40605A Adverse effect of unspecified narcotics, initial encounter: Secondary | ICD-10-CM | POA: Diagnosis not present

## 2018-02-24 DIAGNOSIS — Z79899 Other long term (current) drug therapy: Secondary | ICD-10-CM | POA: Diagnosis not present

## 2018-02-24 DIAGNOSIS — M5126 Other intervertebral disc displacement, lumbar region: Secondary | ICD-10-CM | POA: Diagnosis not present

## 2018-02-24 DIAGNOSIS — M479 Spondylosis, unspecified: Secondary | ICD-10-CM | POA: Diagnosis not present

## 2018-02-24 DIAGNOSIS — R110 Nausea: Secondary | ICD-10-CM | POA: Diagnosis not present

## 2018-02-24 DIAGNOSIS — E785 Hyperlipidemia, unspecified: Secondary | ICD-10-CM | POA: Diagnosis not present

## 2018-02-24 DIAGNOSIS — Z981 Arthrodesis status: Secondary | ICD-10-CM | POA: Diagnosis not present

## 2018-02-24 DIAGNOSIS — Z7982 Long term (current) use of aspirin: Secondary | ICD-10-CM | POA: Diagnosis not present

## 2018-02-24 DIAGNOSIS — M47816 Spondylosis without myelopathy or radiculopathy, lumbar region: Secondary | ICD-10-CM | POA: Diagnosis not present

## 2018-02-24 DIAGNOSIS — I9581 Postprocedural hypotension: Secondary | ICD-10-CM | POA: Diagnosis not present

## 2018-03-03 DIAGNOSIS — Z8546 Personal history of malignant neoplasm of prostate: Secondary | ICD-10-CM | POA: Diagnosis not present

## 2018-03-03 DIAGNOSIS — Z87891 Personal history of nicotine dependence: Secondary | ICD-10-CM | POA: Diagnosis not present

## 2018-03-03 DIAGNOSIS — M4316 Spondylolisthesis, lumbar region: Secondary | ICD-10-CM | POA: Diagnosis not present

## 2018-03-03 DIAGNOSIS — Z4789 Encounter for other orthopedic aftercare: Secondary | ICD-10-CM | POA: Diagnosis not present

## 2018-03-03 DIAGNOSIS — M4726 Other spondylosis with radiculopathy, lumbar region: Secondary | ICD-10-CM | POA: Diagnosis not present

## 2018-03-03 DIAGNOSIS — E7800 Pure hypercholesterolemia, unspecified: Secondary | ICD-10-CM | POA: Diagnosis not present

## 2018-03-04 DIAGNOSIS — Z4789 Encounter for other orthopedic aftercare: Secondary | ICD-10-CM | POA: Diagnosis not present

## 2018-03-04 DIAGNOSIS — M4726 Other spondylosis with radiculopathy, lumbar region: Secondary | ICD-10-CM | POA: Diagnosis not present

## 2018-03-04 DIAGNOSIS — Z8546 Personal history of malignant neoplasm of prostate: Secondary | ICD-10-CM | POA: Diagnosis not present

## 2018-03-04 DIAGNOSIS — E7800 Pure hypercholesterolemia, unspecified: Secondary | ICD-10-CM | POA: Diagnosis not present

## 2018-03-04 DIAGNOSIS — Z87891 Personal history of nicotine dependence: Secondary | ICD-10-CM | POA: Diagnosis not present

## 2018-03-04 DIAGNOSIS — M4316 Spondylolisthesis, lumbar region: Secondary | ICD-10-CM | POA: Diagnosis not present

## 2018-03-06 DIAGNOSIS — M4726 Other spondylosis with radiculopathy, lumbar region: Secondary | ICD-10-CM | POA: Diagnosis not present

## 2018-03-06 DIAGNOSIS — Z87891 Personal history of nicotine dependence: Secondary | ICD-10-CM | POA: Diagnosis not present

## 2018-03-06 DIAGNOSIS — M4316 Spondylolisthesis, lumbar region: Secondary | ICD-10-CM | POA: Diagnosis not present

## 2018-03-06 DIAGNOSIS — Z4789 Encounter for other orthopedic aftercare: Secondary | ICD-10-CM | POA: Diagnosis not present

## 2018-03-06 DIAGNOSIS — Z8546 Personal history of malignant neoplasm of prostate: Secondary | ICD-10-CM | POA: Diagnosis not present

## 2018-03-06 DIAGNOSIS — E7800 Pure hypercholesterolemia, unspecified: Secondary | ICD-10-CM | POA: Diagnosis not present

## 2018-03-07 DIAGNOSIS — N200 Calculus of kidney: Secondary | ICD-10-CM | POA: Diagnosis not present

## 2018-03-07 DIAGNOSIS — Z981 Arthrodesis status: Secondary | ICD-10-CM | POA: Diagnosis not present

## 2018-03-07 DIAGNOSIS — M47816 Spondylosis without myelopathy or radiculopathy, lumbar region: Secondary | ICD-10-CM | POA: Diagnosis not present

## 2018-03-07 DIAGNOSIS — M47896 Other spondylosis, lumbar region: Secondary | ICD-10-CM | POA: Diagnosis not present

## 2018-03-11 DIAGNOSIS — E7800 Pure hypercholesterolemia, unspecified: Secondary | ICD-10-CM | POA: Diagnosis not present

## 2018-03-11 DIAGNOSIS — Z4789 Encounter for other orthopedic aftercare: Secondary | ICD-10-CM | POA: Diagnosis not present

## 2018-03-11 DIAGNOSIS — Z87891 Personal history of nicotine dependence: Secondary | ICD-10-CM | POA: Diagnosis not present

## 2018-03-11 DIAGNOSIS — M4316 Spondylolisthesis, lumbar region: Secondary | ICD-10-CM | POA: Diagnosis not present

## 2018-03-11 DIAGNOSIS — Z6827 Body mass index (BMI) 27.0-27.9, adult: Secondary | ICD-10-CM | POA: Diagnosis not present

## 2018-03-11 DIAGNOSIS — Z299 Encounter for prophylactic measures, unspecified: Secondary | ICD-10-CM | POA: Diagnosis not present

## 2018-03-11 DIAGNOSIS — Z8546 Personal history of malignant neoplasm of prostate: Secondary | ICD-10-CM | POA: Diagnosis not present

## 2018-03-11 DIAGNOSIS — M545 Low back pain: Secondary | ICD-10-CM | POA: Diagnosis not present

## 2018-03-11 DIAGNOSIS — M4726 Other spondylosis with radiculopathy, lumbar region: Secondary | ICD-10-CM | POA: Diagnosis not present

## 2018-03-11 DIAGNOSIS — Z09 Encounter for follow-up examination after completed treatment for conditions other than malignant neoplasm: Secondary | ICD-10-CM | POA: Diagnosis not present

## 2018-03-11 DIAGNOSIS — Z981 Arthrodesis status: Secondary | ICD-10-CM | POA: Diagnosis not present

## 2018-03-13 DIAGNOSIS — M4726 Other spondylosis with radiculopathy, lumbar region: Secondary | ICD-10-CM | POA: Diagnosis not present

## 2018-03-13 DIAGNOSIS — E7800 Pure hypercholesterolemia, unspecified: Secondary | ICD-10-CM | POA: Diagnosis not present

## 2018-03-13 DIAGNOSIS — Z87891 Personal history of nicotine dependence: Secondary | ICD-10-CM | POA: Diagnosis not present

## 2018-03-13 DIAGNOSIS — Z8546 Personal history of malignant neoplasm of prostate: Secondary | ICD-10-CM | POA: Diagnosis not present

## 2018-03-13 DIAGNOSIS — M4316 Spondylolisthesis, lumbar region: Secondary | ICD-10-CM | POA: Diagnosis not present

## 2018-03-13 DIAGNOSIS — Z4789 Encounter for other orthopedic aftercare: Secondary | ICD-10-CM | POA: Diagnosis not present

## 2018-03-17 DIAGNOSIS — M4316 Spondylolisthesis, lumbar region: Secondary | ICD-10-CM | POA: Diagnosis not present

## 2018-03-17 DIAGNOSIS — E7800 Pure hypercholesterolemia, unspecified: Secondary | ICD-10-CM | POA: Diagnosis not present

## 2018-03-17 DIAGNOSIS — Z87891 Personal history of nicotine dependence: Secondary | ICD-10-CM | POA: Diagnosis not present

## 2018-03-17 DIAGNOSIS — Z4789 Encounter for other orthopedic aftercare: Secondary | ICD-10-CM | POA: Diagnosis not present

## 2018-03-17 DIAGNOSIS — Z8546 Personal history of malignant neoplasm of prostate: Secondary | ICD-10-CM | POA: Diagnosis not present

## 2018-03-17 DIAGNOSIS — M4726 Other spondylosis with radiculopathy, lumbar region: Secondary | ICD-10-CM | POA: Diagnosis not present

## 2018-03-19 DIAGNOSIS — M4316 Spondylolisthesis, lumbar region: Secondary | ICD-10-CM | POA: Diagnosis not present

## 2018-03-19 DIAGNOSIS — Z8546 Personal history of malignant neoplasm of prostate: Secondary | ICD-10-CM | POA: Diagnosis not present

## 2018-03-19 DIAGNOSIS — Z4789 Encounter for other orthopedic aftercare: Secondary | ICD-10-CM | POA: Diagnosis not present

## 2018-03-19 DIAGNOSIS — M4726 Other spondylosis with radiculopathy, lumbar region: Secondary | ICD-10-CM | POA: Diagnosis not present

## 2018-03-19 DIAGNOSIS — Z87891 Personal history of nicotine dependence: Secondary | ICD-10-CM | POA: Diagnosis not present

## 2018-03-19 DIAGNOSIS — E7800 Pure hypercholesterolemia, unspecified: Secondary | ICD-10-CM | POA: Diagnosis not present

## 2018-04-18 DIAGNOSIS — N5201 Erectile dysfunction due to arterial insufficiency: Secondary | ICD-10-CM | POA: Diagnosis not present

## 2018-04-18 DIAGNOSIS — C61 Malignant neoplasm of prostate: Secondary | ICD-10-CM | POA: Diagnosis not present

## 2018-05-21 DIAGNOSIS — M47818 Spondylosis without myelopathy or radiculopathy, sacral and sacrococcygeal region: Secondary | ICD-10-CM | POA: Diagnosis not present

## 2018-05-21 DIAGNOSIS — R262 Difficulty in walking, not elsewhere classified: Secondary | ICD-10-CM | POA: Diagnosis not present

## 2018-05-21 DIAGNOSIS — M2450 Contracture, unspecified joint: Secondary | ICD-10-CM | POA: Diagnosis not present

## 2018-05-21 DIAGNOSIS — M545 Low back pain: Secondary | ICD-10-CM | POA: Diagnosis not present

## 2018-05-23 DIAGNOSIS — M2450 Contracture, unspecified joint: Secondary | ICD-10-CM | POA: Diagnosis not present

## 2018-05-23 DIAGNOSIS — M545 Low back pain: Secondary | ICD-10-CM | POA: Diagnosis not present

## 2018-05-23 DIAGNOSIS — R262 Difficulty in walking, not elsewhere classified: Secondary | ICD-10-CM | POA: Diagnosis not present

## 2018-05-23 DIAGNOSIS — M47818 Spondylosis without myelopathy or radiculopathy, sacral and sacrococcygeal region: Secondary | ICD-10-CM | POA: Diagnosis not present

## 2018-05-27 DIAGNOSIS — M2450 Contracture, unspecified joint: Secondary | ICD-10-CM | POA: Diagnosis not present

## 2018-05-27 DIAGNOSIS — M47818 Spondylosis without myelopathy or radiculopathy, sacral and sacrococcygeal region: Secondary | ICD-10-CM | POA: Diagnosis not present

## 2018-05-27 DIAGNOSIS — R262 Difficulty in walking, not elsewhere classified: Secondary | ICD-10-CM | POA: Diagnosis not present

## 2018-05-27 DIAGNOSIS — M545 Low back pain: Secondary | ICD-10-CM | POA: Diagnosis not present

## 2018-06-03 DIAGNOSIS — M47818 Spondylosis without myelopathy or radiculopathy, sacral and sacrococcygeal region: Secondary | ICD-10-CM | POA: Diagnosis not present

## 2018-06-03 DIAGNOSIS — R262 Difficulty in walking, not elsewhere classified: Secondary | ICD-10-CM | POA: Diagnosis not present

## 2018-06-03 DIAGNOSIS — M2450 Contracture, unspecified joint: Secondary | ICD-10-CM | POA: Diagnosis not present

## 2018-06-03 DIAGNOSIS — M545 Low back pain: Secondary | ICD-10-CM | POA: Diagnosis not present

## 2018-06-06 DIAGNOSIS — M2450 Contracture, unspecified joint: Secondary | ICD-10-CM | POA: Diagnosis not present

## 2018-06-06 DIAGNOSIS — M545 Low back pain: Secondary | ICD-10-CM | POA: Diagnosis not present

## 2018-06-06 DIAGNOSIS — M47818 Spondylosis without myelopathy or radiculopathy, sacral and sacrococcygeal region: Secondary | ICD-10-CM | POA: Diagnosis not present

## 2018-06-06 DIAGNOSIS — R262 Difficulty in walking, not elsewhere classified: Secondary | ICD-10-CM | POA: Diagnosis not present

## 2018-06-10 DIAGNOSIS — M545 Low back pain: Secondary | ICD-10-CM | POA: Diagnosis not present

## 2018-06-10 DIAGNOSIS — M2450 Contracture, unspecified joint: Secondary | ICD-10-CM | POA: Diagnosis not present

## 2018-06-10 DIAGNOSIS — R262 Difficulty in walking, not elsewhere classified: Secondary | ICD-10-CM | POA: Diagnosis not present

## 2018-06-10 DIAGNOSIS — M47818 Spondylosis without myelopathy or radiculopathy, sacral and sacrococcygeal region: Secondary | ICD-10-CM | POA: Diagnosis not present

## 2018-06-13 DIAGNOSIS — R262 Difficulty in walking, not elsewhere classified: Secondary | ICD-10-CM | POA: Diagnosis not present

## 2018-06-13 DIAGNOSIS — M2450 Contracture, unspecified joint: Secondary | ICD-10-CM | POA: Diagnosis not present

## 2018-06-13 DIAGNOSIS — M545 Low back pain: Secondary | ICD-10-CM | POA: Diagnosis not present

## 2018-06-13 DIAGNOSIS — M47818 Spondylosis without myelopathy or radiculopathy, sacral and sacrococcygeal region: Secondary | ICD-10-CM | POA: Diagnosis not present

## 2018-06-17 DIAGNOSIS — R262 Difficulty in walking, not elsewhere classified: Secondary | ICD-10-CM | POA: Diagnosis not present

## 2018-06-17 DIAGNOSIS — M47818 Spondylosis without myelopathy or radiculopathy, sacral and sacrococcygeal region: Secondary | ICD-10-CM | POA: Diagnosis not present

## 2018-06-17 DIAGNOSIS — M2450 Contracture, unspecified joint: Secondary | ICD-10-CM | POA: Diagnosis not present

## 2018-06-17 DIAGNOSIS — M545 Low back pain: Secondary | ICD-10-CM | POA: Diagnosis not present

## 2018-06-20 DIAGNOSIS — M2450 Contracture, unspecified joint: Secondary | ICD-10-CM | POA: Diagnosis not present

## 2018-06-20 DIAGNOSIS — M545 Low back pain: Secondary | ICD-10-CM | POA: Diagnosis not present

## 2018-06-20 DIAGNOSIS — M47818 Spondylosis without myelopathy or radiculopathy, sacral and sacrococcygeal region: Secondary | ICD-10-CM | POA: Diagnosis not present

## 2018-06-20 DIAGNOSIS — R262 Difficulty in walking, not elsewhere classified: Secondary | ICD-10-CM | POA: Diagnosis not present

## 2018-06-23 DIAGNOSIS — M545 Low back pain: Secondary | ICD-10-CM | POA: Diagnosis not present

## 2018-06-23 DIAGNOSIS — M2450 Contracture, unspecified joint: Secondary | ICD-10-CM | POA: Diagnosis not present

## 2018-06-23 DIAGNOSIS — R262 Difficulty in walking, not elsewhere classified: Secondary | ICD-10-CM | POA: Diagnosis not present

## 2018-06-23 DIAGNOSIS — M47818 Spondylosis without myelopathy or radiculopathy, sacral and sacrococcygeal region: Secondary | ICD-10-CM | POA: Diagnosis not present

## 2018-06-24 DIAGNOSIS — Z23 Encounter for immunization: Secondary | ICD-10-CM | POA: Diagnosis not present

## 2018-06-27 DIAGNOSIS — M47818 Spondylosis without myelopathy or radiculopathy, sacral and sacrococcygeal region: Secondary | ICD-10-CM | POA: Diagnosis not present

## 2018-06-27 DIAGNOSIS — M2450 Contracture, unspecified joint: Secondary | ICD-10-CM | POA: Diagnosis not present

## 2018-06-27 DIAGNOSIS — R262 Difficulty in walking, not elsewhere classified: Secondary | ICD-10-CM | POA: Diagnosis not present

## 2018-06-27 DIAGNOSIS — M545 Low back pain: Secondary | ICD-10-CM | POA: Diagnosis not present

## 2018-06-30 DIAGNOSIS — M2450 Contracture, unspecified joint: Secondary | ICD-10-CM | POA: Diagnosis not present

## 2018-06-30 DIAGNOSIS — M545 Low back pain: Secondary | ICD-10-CM | POA: Diagnosis not present

## 2018-06-30 DIAGNOSIS — M47818 Spondylosis without myelopathy or radiculopathy, sacral and sacrococcygeal region: Secondary | ICD-10-CM | POA: Diagnosis not present

## 2018-06-30 DIAGNOSIS — R262 Difficulty in walking, not elsewhere classified: Secondary | ICD-10-CM | POA: Diagnosis not present

## 2018-07-01 DIAGNOSIS — Z299 Encounter for prophylactic measures, unspecified: Secondary | ICD-10-CM | POA: Diagnosis not present

## 2018-07-01 DIAGNOSIS — E7800 Pure hypercholesterolemia, unspecified: Secondary | ICD-10-CM | POA: Diagnosis not present

## 2018-07-01 DIAGNOSIS — Z79899 Other long term (current) drug therapy: Secondary | ICD-10-CM | POA: Diagnosis not present

## 2018-07-01 DIAGNOSIS — N200 Calculus of kidney: Secondary | ICD-10-CM | POA: Diagnosis not present

## 2018-07-01 DIAGNOSIS — Z6830 Body mass index (BMI) 30.0-30.9, adult: Secondary | ICD-10-CM | POA: Diagnosis not present

## 2018-07-31 DIAGNOSIS — M47816 Spondylosis without myelopathy or radiculopathy, lumbar region: Secondary | ICD-10-CM | POA: Diagnosis not present

## 2018-10-22 DIAGNOSIS — C61 Malignant neoplasm of prostate: Secondary | ICD-10-CM | POA: Diagnosis not present

## 2018-10-22 DIAGNOSIS — N5201 Erectile dysfunction due to arterial insufficiency: Secondary | ICD-10-CM | POA: Diagnosis not present

## 2018-11-04 DIAGNOSIS — M4316 Spondylolisthesis, lumbar region: Secondary | ICD-10-CM | POA: Diagnosis not present

## 2018-11-04 DIAGNOSIS — M47816 Spondylosis without myelopathy or radiculopathy, lumbar region: Secondary | ICD-10-CM | POA: Diagnosis not present

## 2018-11-04 DIAGNOSIS — M48062 Spinal stenosis, lumbar region with neurogenic claudication: Secondary | ICD-10-CM | POA: Diagnosis not present

## 2019-02-05 DIAGNOSIS — Z125 Encounter for screening for malignant neoplasm of prostate: Secondary | ICD-10-CM | POA: Diagnosis not present

## 2019-02-05 DIAGNOSIS — R5383 Other fatigue: Secondary | ICD-10-CM | POA: Diagnosis not present

## 2019-02-05 DIAGNOSIS — Z79899 Other long term (current) drug therapy: Secondary | ICD-10-CM | POA: Diagnosis not present

## 2019-02-05 DIAGNOSIS — K219 Gastro-esophageal reflux disease without esophagitis: Secondary | ICD-10-CM | POA: Diagnosis not present

## 2019-02-05 DIAGNOSIS — E7800 Pure hypercholesterolemia, unspecified: Secondary | ICD-10-CM | POA: Diagnosis not present

## 2019-02-05 DIAGNOSIS — Z0000 Encounter for general adult medical examination without abnormal findings: Secondary | ICD-10-CM | POA: Diagnosis not present

## 2019-02-05 DIAGNOSIS — R42 Dizziness and giddiness: Secondary | ICD-10-CM | POA: Diagnosis not present

## 2019-02-05 DIAGNOSIS — Z1331 Encounter for screening for depression: Secondary | ICD-10-CM | POA: Diagnosis not present

## 2019-02-05 DIAGNOSIS — Z7189 Other specified counseling: Secondary | ICD-10-CM | POA: Diagnosis not present

## 2019-02-05 DIAGNOSIS — Z6830 Body mass index (BMI) 30.0-30.9, adult: Secondary | ICD-10-CM | POA: Diagnosis not present

## 2019-02-05 DIAGNOSIS — M1990 Unspecified osteoarthritis, unspecified site: Secondary | ICD-10-CM | POA: Diagnosis not present

## 2019-02-05 DIAGNOSIS — Z299 Encounter for prophylactic measures, unspecified: Secondary | ICD-10-CM | POA: Diagnosis not present

## 2019-02-05 DIAGNOSIS — Z1339 Encounter for screening examination for other mental health and behavioral disorders: Secondary | ICD-10-CM | POA: Diagnosis not present

## 2019-02-05 DIAGNOSIS — Z1211 Encounter for screening for malignant neoplasm of colon: Secondary | ICD-10-CM | POA: Diagnosis not present

## 2019-02-17 DIAGNOSIS — I7389 Other specified peripheral vascular diseases: Secondary | ICD-10-CM | POA: Diagnosis not present

## 2019-02-17 DIAGNOSIS — H81393 Other peripheral vertigo, bilateral: Secondary | ICD-10-CM | POA: Diagnosis not present

## 2019-02-17 DIAGNOSIS — G9009 Other idiopathic peripheral autonomic neuropathy: Secondary | ICD-10-CM | POA: Diagnosis not present

## 2019-02-26 DIAGNOSIS — Z713 Dietary counseling and surveillance: Secondary | ICD-10-CM | POA: Diagnosis not present

## 2019-02-26 DIAGNOSIS — R42 Dizziness and giddiness: Secondary | ICD-10-CM | POA: Diagnosis not present

## 2019-02-26 DIAGNOSIS — Z6830 Body mass index (BMI) 30.0-30.9, adult: Secondary | ICD-10-CM | POA: Diagnosis not present

## 2019-02-26 DIAGNOSIS — Z299 Encounter for prophylactic measures, unspecified: Secondary | ICD-10-CM | POA: Diagnosis not present

## 2019-04-30 DIAGNOSIS — Z713 Dietary counseling and surveillance: Secondary | ICD-10-CM | POA: Diagnosis not present

## 2019-04-30 DIAGNOSIS — Z299 Encounter for prophylactic measures, unspecified: Secondary | ICD-10-CM | POA: Diagnosis not present

## 2019-04-30 DIAGNOSIS — R509 Fever, unspecified: Secondary | ICD-10-CM | POA: Diagnosis not present

## 2019-04-30 DIAGNOSIS — Z6830 Body mass index (BMI) 30.0-30.9, adult: Secondary | ICD-10-CM | POA: Diagnosis not present

## 2019-05-27 DIAGNOSIS — N5201 Erectile dysfunction due to arterial insufficiency: Secondary | ICD-10-CM | POA: Diagnosis not present

## 2019-05-27 DIAGNOSIS — C61 Malignant neoplasm of prostate: Secondary | ICD-10-CM | POA: Diagnosis not present

## 2019-06-02 DIAGNOSIS — H81393 Other peripheral vertigo, bilateral: Secondary | ICD-10-CM | POA: Diagnosis not present

## 2019-06-29 DIAGNOSIS — Z23 Encounter for immunization: Secondary | ICD-10-CM | POA: Diagnosis not present

## 2019-11-24 DIAGNOSIS — N5201 Erectile dysfunction due to arterial insufficiency: Secondary | ICD-10-CM | POA: Diagnosis not present

## 2019-11-24 DIAGNOSIS — C61 Malignant neoplasm of prostate: Secondary | ICD-10-CM | POA: Diagnosis not present

## 2020-05-26 DIAGNOSIS — E7800 Pure hypercholesterolemia, unspecified: Secondary | ICD-10-CM | POA: Diagnosis not present

## 2020-05-26 DIAGNOSIS — R5383 Other fatigue: Secondary | ICD-10-CM | POA: Diagnosis not present

## 2020-05-26 DIAGNOSIS — Z299 Encounter for prophylactic measures, unspecified: Secondary | ICD-10-CM | POA: Diagnosis not present

## 2020-05-26 DIAGNOSIS — M1990 Unspecified osteoarthritis, unspecified site: Secondary | ICD-10-CM | POA: Diagnosis not present

## 2020-05-26 DIAGNOSIS — K5792 Diverticulitis of intestine, part unspecified, without perforation or abscess without bleeding: Secondary | ICD-10-CM | POA: Diagnosis not present

## 2020-06-06 DIAGNOSIS — Z23 Encounter for immunization: Secondary | ICD-10-CM | POA: Diagnosis not present

## 2020-06-21 DIAGNOSIS — Z23 Encounter for immunization: Secondary | ICD-10-CM | POA: Diagnosis not present

## 2020-06-29 DIAGNOSIS — N5201 Erectile dysfunction due to arterial insufficiency: Secondary | ICD-10-CM | POA: Diagnosis not present

## 2020-06-29 DIAGNOSIS — C61 Malignant neoplasm of prostate: Secondary | ICD-10-CM | POA: Diagnosis not present

## 2020-08-09 DIAGNOSIS — E7849 Other hyperlipidemia: Secondary | ICD-10-CM | POA: Diagnosis not present

## 2020-08-09 DIAGNOSIS — M1990 Unspecified osteoarthritis, unspecified site: Secondary | ICD-10-CM | POA: Diagnosis not present

## 2020-09-09 DIAGNOSIS — E7849 Other hyperlipidemia: Secondary | ICD-10-CM | POA: Diagnosis not present

## 2020-09-09 DIAGNOSIS — M1990 Unspecified osteoarthritis, unspecified site: Secondary | ICD-10-CM | POA: Diagnosis not present

## 2020-09-19 DIAGNOSIS — Z299 Encounter for prophylactic measures, unspecified: Secondary | ICD-10-CM | POA: Diagnosis not present

## 2020-09-19 DIAGNOSIS — Z6829 Body mass index (BMI) 29.0-29.9, adult: Secondary | ICD-10-CM | POA: Diagnosis not present

## 2020-09-19 DIAGNOSIS — R030 Elevated blood-pressure reading, without diagnosis of hypertension: Secondary | ICD-10-CM | POA: Diagnosis not present

## 2020-09-19 DIAGNOSIS — K219 Gastro-esophageal reflux disease without esophagitis: Secondary | ICD-10-CM | POA: Diagnosis not present

## 2020-09-19 DIAGNOSIS — K5730 Diverticulosis of large intestine without perforation or abscess without bleeding: Secondary | ICD-10-CM | POA: Diagnosis not present

## 2020-09-19 DIAGNOSIS — M25552 Pain in left hip: Secondary | ICD-10-CM | POA: Diagnosis not present

## 2020-09-19 DIAGNOSIS — M25551 Pain in right hip: Secondary | ICD-10-CM | POA: Diagnosis not present

## 2020-09-19 DIAGNOSIS — Z981 Arthrodesis status: Secondary | ICD-10-CM | POA: Diagnosis not present

## 2020-09-19 DIAGNOSIS — C61 Malignant neoplasm of prostate: Secondary | ICD-10-CM | POA: Diagnosis not present

## 2020-11-07 DIAGNOSIS — M1990 Unspecified osteoarthritis, unspecified site: Secondary | ICD-10-CM | POA: Diagnosis not present

## 2020-11-07 DIAGNOSIS — E7849 Other hyperlipidemia: Secondary | ICD-10-CM | POA: Diagnosis not present

## 2021-01-30 DIAGNOSIS — Z23 Encounter for immunization: Secondary | ICD-10-CM | POA: Diagnosis not present

## 2021-02-03 DIAGNOSIS — N5201 Erectile dysfunction due to arterial insufficiency: Secondary | ICD-10-CM | POA: Diagnosis not present

## 2021-02-03 DIAGNOSIS — C61 Malignant neoplasm of prostate: Secondary | ICD-10-CM | POA: Diagnosis not present

## 2021-02-14 DIAGNOSIS — M5416 Radiculopathy, lumbar region: Secondary | ICD-10-CM | POA: Diagnosis not present

## 2021-03-08 DIAGNOSIS — M5416 Radiculopathy, lumbar region: Secondary | ICD-10-CM | POA: Diagnosis not present

## 2021-03-08 DIAGNOSIS — M545 Low back pain, unspecified: Secondary | ICD-10-CM | POA: Diagnosis not present

## 2021-03-20 DIAGNOSIS — M47816 Spondylosis without myelopathy or radiculopathy, lumbar region: Secondary | ICD-10-CM | POA: Diagnosis not present

## 2021-03-20 DIAGNOSIS — M5126 Other intervertebral disc displacement, lumbar region: Secondary | ICD-10-CM | POA: Diagnosis not present

## 2021-03-20 DIAGNOSIS — M5440 Lumbago with sciatica, unspecified side: Secondary | ICD-10-CM | POA: Diagnosis not present

## 2021-03-20 DIAGNOSIS — M5416 Radiculopathy, lumbar region: Secondary | ICD-10-CM | POA: Diagnosis not present

## 2021-03-20 DIAGNOSIS — M48061 Spinal stenosis, lumbar region without neurogenic claudication: Secondary | ICD-10-CM | POA: Diagnosis not present

## 2021-03-22 DIAGNOSIS — Z0000 Encounter for general adult medical examination without abnormal findings: Secondary | ICD-10-CM | POA: Diagnosis not present

## 2021-03-22 DIAGNOSIS — Z125 Encounter for screening for malignant neoplasm of prostate: Secondary | ICD-10-CM | POA: Diagnosis not present

## 2021-03-22 DIAGNOSIS — Z299 Encounter for prophylactic measures, unspecified: Secondary | ICD-10-CM | POA: Diagnosis not present

## 2021-03-22 DIAGNOSIS — Z1331 Encounter for screening for depression: Secondary | ICD-10-CM | POA: Diagnosis not present

## 2021-03-22 DIAGNOSIS — E7800 Pure hypercholesterolemia, unspecified: Secondary | ICD-10-CM | POA: Diagnosis not present

## 2021-03-22 DIAGNOSIS — Z79899 Other long term (current) drug therapy: Secondary | ICD-10-CM | POA: Diagnosis not present

## 2021-03-22 DIAGNOSIS — Z6830 Body mass index (BMI) 30.0-30.9, adult: Secondary | ICD-10-CM | POA: Diagnosis not present

## 2021-03-22 DIAGNOSIS — Z7189 Other specified counseling: Secondary | ICD-10-CM | POA: Diagnosis not present

## 2021-03-22 DIAGNOSIS — Z1339 Encounter for screening examination for other mental health and behavioral disorders: Secondary | ICD-10-CM | POA: Diagnosis not present

## 2021-03-22 DIAGNOSIS — R5383 Other fatigue: Secondary | ICD-10-CM | POA: Diagnosis not present

## 2021-04-09 DIAGNOSIS — M1990 Unspecified osteoarthritis, unspecified site: Secondary | ICD-10-CM | POA: Diagnosis not present

## 2021-04-09 DIAGNOSIS — E7849 Other hyperlipidemia: Secondary | ICD-10-CM | POA: Diagnosis not present

## 2021-04-18 DIAGNOSIS — M5416 Radiculopathy, lumbar region: Secondary | ICD-10-CM | POA: Diagnosis not present

## 2021-05-16 DIAGNOSIS — R030 Elevated blood-pressure reading, without diagnosis of hypertension: Secondary | ICD-10-CM | POA: Diagnosis not present

## 2021-05-16 DIAGNOSIS — Z6830 Body mass index (BMI) 30.0-30.9, adult: Secondary | ICD-10-CM | POA: Diagnosis not present

## 2021-05-16 DIAGNOSIS — M5416 Radiculopathy, lumbar region: Secondary | ICD-10-CM | POA: Diagnosis not present

## 2021-05-19 DIAGNOSIS — R251 Tremor, unspecified: Secondary | ICD-10-CM | POA: Diagnosis not present

## 2021-05-19 DIAGNOSIS — B356 Tinea cruris: Secondary | ICD-10-CM | POA: Diagnosis not present

## 2021-05-19 DIAGNOSIS — R21 Rash and other nonspecific skin eruption: Secondary | ICD-10-CM | POA: Diagnosis not present

## 2021-05-19 DIAGNOSIS — Z299 Encounter for prophylactic measures, unspecified: Secondary | ICD-10-CM | POA: Diagnosis not present

## 2021-06-01 DIAGNOSIS — Z299 Encounter for prophylactic measures, unspecified: Secondary | ICD-10-CM | POA: Diagnosis not present

## 2021-06-01 DIAGNOSIS — K219 Gastro-esophageal reflux disease without esophagitis: Secondary | ICD-10-CM | POA: Diagnosis not present

## 2021-06-01 DIAGNOSIS — Z6829 Body mass index (BMI) 29.0-29.9, adult: Secondary | ICD-10-CM | POA: Diagnosis not present

## 2021-06-01 DIAGNOSIS — F419 Anxiety disorder, unspecified: Secondary | ICD-10-CM | POA: Diagnosis not present

## 2021-06-01 DIAGNOSIS — B356 Tinea cruris: Secondary | ICD-10-CM | POA: Diagnosis not present

## 2021-06-08 DIAGNOSIS — M5416 Radiculopathy, lumbar region: Secondary | ICD-10-CM | POA: Diagnosis not present

## 2021-06-28 DIAGNOSIS — M5416 Radiculopathy, lumbar region: Secondary | ICD-10-CM | POA: Diagnosis not present

## 2021-06-29 DIAGNOSIS — Z23 Encounter for immunization: Secondary | ICD-10-CM | POA: Diagnosis not present

## 2021-07-03 DIAGNOSIS — M5416 Radiculopathy, lumbar region: Secondary | ICD-10-CM | POA: Diagnosis not present

## 2021-07-10 DIAGNOSIS — M1990 Unspecified osteoarthritis, unspecified site: Secondary | ICD-10-CM | POA: Diagnosis not present

## 2021-07-10 DIAGNOSIS — E7849 Other hyperlipidemia: Secondary | ICD-10-CM | POA: Diagnosis not present

## 2021-07-27 ENCOUNTER — Encounter: Payer: Self-pay | Admitting: *Deleted

## 2021-07-28 ENCOUNTER — Encounter: Payer: Self-pay | Admitting: Neurology

## 2021-07-28 ENCOUNTER — Ambulatory Visit (INDEPENDENT_AMBULATORY_CARE_PROVIDER_SITE_OTHER): Payer: Medicare Other | Admitting: Neurology

## 2021-07-28 VITALS — BP 141/81 | HR 82 | Ht 67.0 in | Wt 200.0 lb

## 2021-07-28 DIAGNOSIS — R251 Tremor, unspecified: Secondary | ICD-10-CM | POA: Diagnosis not present

## 2021-07-28 MED ORDER — PROPRANOLOL HCL 20 MG PO TABS: 20.0000 mg | ORAL_TABLET | Freq: Two times a day (BID) | ORAL | 11 refills | Status: AC | PRN

## 2021-07-28 NOTE — Progress Notes (Signed)
Chief Complaint  Patient presents with   New Patient (Initial Visit)    Rm 15, Tremors,  started 2019 after back surgery.  Progressively getting worse.  L hand > R.        ASSESSMENT AND PLAN  Albert Graham is a 70 y.o. male   Bilateral hands tremor  Most consistent with action tremor, essential tremor, no family history,  Laboratory evaluation including TSH to rule out treatable etiology  Propanolol 20 mg as needed twice daily,  Call clinic for worsening symptoms   DIAGNOSTIC DATA (LABS, IMAGING, TESTING) - I reviewed patient records, labs, notes, testing and imaging myself where available.   MEDICAL HISTORY:  Albert Graham is a 69 year old male, seen in request by his primary care physician Dr. Woody Seller, Dhruv evaluation of bilateral hands tremor, initial evaluation was on July 28, 2021    I reviewed and summarized the referring note. PMHx. 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  PHYSICAL EXAMNIATION:  Gen: NAD, conversant, well nourised, well groomed                      Cardiovascular: Regular rate rhythm, no peripheral edema, warm, nontender. Eyes: Conjunctivae clear without exudates or hemorrhage Neck: Supple, no carotid bruits. Pulmonary: Clear to auscultation bilaterally   NEUROLOGICAL EXAM:  MENTAL STATUS: Speech:    Speech is normal; fluent and spontaneous with normal comprehension.  Cognition:     Orientation to time, place and person     Normal recent and remote memory     Normal Attention span and concentration     Normal Language, naming, repeating,spontaneous speech     Fund of knowledge   CRANIAL NERVES: CN II: Visual fields are full to confrontation. Pupils are round equal and briskly reactive to light. CN III, IV, VI: extraocular movement are normal. No ptosis. CN V: Facial sensation is intact to light touch CN VII: Face is symmetric with normal eye closure  CN VIII: Hearing is normal to causal conversation. CN IX, X: Phonation is normal. CN XI: Head turning and shoulder shrug are intact  MOTOR: Mild bilateral hands action tremor, no weakness, no rigidity or bradykinesia  REFLEXES: Reflexes are 2+ and symmetric at the biceps, triceps, knees, and ankles. Plantar responses are flexor.  SENSORY: Intact to light touch, pinprick and vibratory sensation are intact in fingers and toes.  COORDINATION: There is no trunk or limb dysmetria noted.  GAIT/STANCE: Need push-up to get up from seated  position, antalgic  REVIEW OF SYSTEMS:  Full 14 system review of systems performed and notable only for as above All other review of systems were negative.   ALLERGIES: Allergies  Allergen Reactions   Ciprofloxacin Diarrhea   Codeine Nausea And Vomiting   Voltaren [Diclofenac]     Had GI upset    HOME MEDICATIONS: Current Outpatient Medications  Medication Sig Dispense Refill   acetaminophen (TYLENOL) 325 MG tablet Take 650 mg by mouth every 6 (six) hours as needed.     aspirin EC 81 MG tablet Take 81 mg by mouth daily.       atorvastatin (LIPITOR) 10 MG tablet Take 5 mg by mouth at bedtime.   0   clotrimazole-betamethasone (LOTRISONE) cream Apply topically 2 (two) times daily as needed.     co-enzyme Q-10 30 MG capsule take 1 capsule by oral route every day     diclofenac Sodium (VOLTAREN) 1 % GEL Apply 2 g topically 2 (two) times daily as needed.     diphenhydrAMINE (BENADRYL) 25 mg capsule Take 25 mg by mouth every 6 (six) hours as needed (for runny nose/allergies.).     gabapentin (NEURONTIN) 100 MG capsule Take 100 mg by mouth 2 (two) times daily.     ketoconazole (NIZORAL) 2 % cream Apply 1 application topically 2 (two) times daily.     meloxicam (MOBIC) 15 MG tablet Take by mouth.     Multiple Vitamin (MULTI-VITAMIN) tablet take 1 tablet by oral route every day     Multiple Vitamin (MULTI-VITAMINS) TABS Take 1 tablet by mouth daily.     traMADol (ULTRAM) 50 MG tablet Take by mouth every 6 (six) hours as needed. Takes one tablet about twice weekly for back pain     diclofenac (VOLTAREN) 75 MG EC tablet Take 75 mg by mouth daily. (Patient not taking: Reported on 07/28/2021)     No current facility-administered medications for this visit.    PAST MEDICAL HISTORY: Past Medical History:  Diagnosis Date   Arthritis    Back pain    Blood clot associated with vein wall inflammation 20 yrs ago   left leg    Cancer (HCC)    prostate    Diverticulitis    Dyslipidemia    Elevated PSA    GERD (gastroesophageal reflux disease)    Hemorrhoids    HOH (hard of hearing)    both ears   Joint pain    Kidney stone    PONV (postoperative nausea and vomiting)    likes phenergan    PAST SURGICAL HISTORY: Past Surgical History:  Procedure Laterality Date   BACK SURGERY  12/31/2013   lower   BIOPSY  08/15/2017   Procedure: BIOPSY;  Surgeon: Daneil Dolin, MD;  [facility];  Service: Endoscopy;;  colon   COLONOSCOPY N/A 08/15/2017   Procedure: COLONOSCOPY;  Surgeon: Daneil Dolin, MD;  [facility];  Service: Endoscopy;  Laterality: N/A;  11:45 AM   foot surgery for arthritis Bilateral    left leg tendon cut also   LYMPHADENECTOMY Bilateral 10/18/2016   Procedure: PELVIC LYMPHADENECTOMY;  Surgeon: Raynelle Bring, MD;  Location: [facility];  Service: Urology;  Laterality: Bilateral;   ROBOT ASSISTED LAPAROSCOPIC RADICAL PROSTATECTOMY N/A 10/18/2016   Procedure: XI ROBOTIC ASSISTED LAPAROSCOPIC RADICAL PROSTATECTOMY LEVEL 2;  Surgeon: Raynelle Bring, MD;  Location: [facility];  Service: Urology;  Laterality: N/A;    FAMILY HISTORY: Family History  Problem Relation Age of  Onset   Brain cancer Mother    Stomach cancer Father    Colon cancer Neg Hx     SOCIAL HISTORY: Social History   Socioeconomic History   Marital status: Married    Spouse name: Not on file   Number of children: Not on file   Years of education: Not on file   Highest education level: Not on file  Occupational History   Not on file  Tobacco Use   Smoking status: Never   Smokeless tobacco: Never  Vaping Use   Vaping Use: Never used  Substance and Sexual Activity   Alcohol use: No   Drug use: No   Sexual activity: Not on file  Other Topics Concern   Not on file  Social History Narrative   Lives home with wife, education HS, Caffeine none.  Children 1. 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    Social Determinants of Health   Financial Resource Strain: Not on file  Food Insecurity: Not on file  Transportation Needs: Not on file  Physical Activity: Not on file  Stress: Not on file  Social Connections: Not on file  Intimate Partner Violence: Not on file      Marcial Pacas, M.D. Ph.D.  [facility] [address] [place], [place] [address] Ph: [phone] Fax: [phone]  CC:  Glenda Chroman, MD [place],  [place] [address]  Glenda Chroman, MD

## 2021-07-29 LAB — CBC WITH DIFFERENTIAL
Basophils Absolute: 0 10*3/uL (ref 0.0–0.2)
Basos: 1 %
EOS (ABSOLUTE): 0.1 10*3/uL (ref 0.0–0.4)
Eos: 1 %
Hematocrit: 46.3 % (ref 37.5–51.0)
Hemoglobin: 15.9 g/dL (ref 13.0–17.7)
Immature Grans (Abs): 0.1 10*3/uL (ref 0.0–0.1)
Immature Granulocytes: 1 %
Lymphocytes Absolute: 1.2 10*3/uL (ref 0.7–3.1)
Lymphs: 17 %
MCH: 29.8 pg (ref 26.6–33.0)
MCHC: 34.3 g/dL (ref 31.5–35.7)
MCV: 87 fL (ref 79–97)
Monocytes Absolute: 0.6 10*3/uL (ref 0.1–0.9)
Monocytes: 8 %
Neutrophils Absolute: 5.4 10*3/uL (ref 1.4–7.0)
Neutrophils: 72 %
RBC: 5.33 x10E6/uL (ref 4.14–5.80)
RDW: 12.9 % (ref 11.6–15.4)
WBC: 7.4 10*3/uL (ref 3.4–10.8)

## 2021-07-29 LAB — COMPREHENSIVE METABOLIC PANEL
ALT: 23 IU/L (ref 0–44)
AST: 29 IU/L (ref 0–40)
Albumin/Globulin Ratio: 1.8 (ref 1.2–2.2)
Albumin: 4.6 g/dL (ref 3.8–4.8)
Alkaline Phosphatase: 70 IU/L (ref 44–121)
BUN/Creatinine Ratio: 16 (ref 10–24)
BUN: 15 mg/dL (ref 8–27)
Bilirubin Total: 0.4 mg/dL (ref 0.0–1.2)
CO2: 23 mmol/L (ref 20–29)
Calcium: 10.1 mg/dL (ref 8.6–10.2)
Chloride: 103 mmol/L (ref 96–106)
Creatinine, Ser: 0.92 mg/dL (ref 0.76–1.27)
Globulin, Total: 2.6 g/dL (ref 1.5–4.5)
Glucose: 87 mg/dL (ref 70–99)
Potassium: 5.3 mmol/L — ABNORMAL HIGH (ref 3.5–5.2)
Sodium: 142 mmol/L (ref 134–144)
Total Protein: 7.2 g/dL (ref 6.0–8.5)
eGFR: 90 mL/min/{1.73_m2} (ref >59)

## 2021-07-29 LAB — TSH: TSH: 1.97 u[IU]/mL (ref 0.450–4.500)

## 2021-07-31 ENCOUNTER — Telehealth: Payer: Self-pay | Admitting: Neurology

## 2021-07-31 NOTE — Telephone Encounter (Signed)
I spoke to the patient and provided him with the lab results.  

## 2021-07-31 NOTE — Telephone Encounter (Signed)
Please call patient, there is no significant abnormality on laboratory evaluations.

## 2021-08-09 DIAGNOSIS — I10 Essential (primary) hypertension: Secondary | ICD-10-CM | POA: Diagnosis not present

## 2021-08-09 DIAGNOSIS — E7800 Pure hypercholesterolemia, unspecified: Secondary | ICD-10-CM | POA: Diagnosis not present

## 2021-08-14 DIAGNOSIS — Z6831 Body mass index (BMI) 31.0-31.9, adult: Secondary | ICD-10-CM | POA: Diagnosis not present

## 2021-08-14 DIAGNOSIS — R030 Elevated blood-pressure reading, without diagnosis of hypertension: Secondary | ICD-10-CM | POA: Diagnosis not present

## 2021-08-14 DIAGNOSIS — M48061 Spinal stenosis, lumbar region without neurogenic claudication: Secondary | ICD-10-CM | POA: Diagnosis not present

## 2021-08-14 DIAGNOSIS — M5416 Radiculopathy, lumbar region: Secondary | ICD-10-CM | POA: Diagnosis not present

## 2021-08-14 DIAGNOSIS — M47816 Spondylosis without myelopathy or radiculopathy, lumbar region: Secondary | ICD-10-CM | POA: Diagnosis not present

## 2021-08-24 DIAGNOSIS — Z299 Encounter for prophylactic measures, unspecified: Secondary | ICD-10-CM | POA: Diagnosis not present

## 2021-08-24 DIAGNOSIS — Z6831 Body mass index (BMI) 31.0-31.9, adult: Secondary | ICD-10-CM | POA: Diagnosis not present

## 2021-08-24 DIAGNOSIS — Z789 Other specified health status: Secondary | ICD-10-CM | POA: Diagnosis not present

## 2021-08-24 DIAGNOSIS — Z01818 Encounter for other preprocedural examination: Secondary | ICD-10-CM | POA: Diagnosis not present

## 2021-08-24 DIAGNOSIS — M545 Low back pain, unspecified: Secondary | ICD-10-CM | POA: Diagnosis not present

## 2021-08-24 DIAGNOSIS — R52 Pain, unspecified: Secondary | ICD-10-CM | POA: Diagnosis not present

## 2021-08-28 ENCOUNTER — Other Ambulatory Visit: Payer: Self-pay | Admitting: Neurological Surgery

## 2021-09-01 DIAGNOSIS — Z20822 Contact with and (suspected) exposure to covid-19: Secondary | ICD-10-CM | POA: Diagnosis not present

## 2021-09-12 ENCOUNTER — Other Ambulatory Visit: Payer: Self-pay | Admitting: Neurological Surgery

## 2021-09-14 NOTE — Pre-Procedure Instructions (Signed)
Surgical Instructions    Your procedure is scheduled on Tuesday, January 10th.  Report to [facility] Main Entrance "A" at 05:30 A.M., then check in with the Admitting office.  Call this number if you have problems the morning of surgery:  [phone]   If you have any questions prior to your surgery date call [phone]: Open Monday-Friday 8am-4pm    Remember:  Do not eat or drink after midnight the night before your surgery      Take these medicines the morning of surgery with A SIP OF WATER    If needed: acetaminophen (TYLENOL)  diphenhydrAMINE (BENADRYL)  gabapentin (NEURONTIN) propranolol (INDERAL)  traMADol (ULTRAM)  As of today, STOP taking any Aspirin (unless otherwise instructed by your surgeon) Aleve, Naproxen, Ibuprofen, Motrin, Advil, Goody's, BC's, all herbal medications, fish oil, and all vitamins. This includes your diclofenac Sodium (VOLTAREN) 1 % GEL and meloxicam (MOBIC).                     Do NOT Smoke (Tobacco/Vaping) or drink Alcohol 24 hours prior to your procedure.  If you use a CPAP at night, you may bring all equipment for your overnight stay.   Contacts, glasses, piercing's, hearing aid's, dentures or partials may not be worn into surgery, please bring cases for these belongings.    For patients admitted to the hospital, discharge time will be determined by your treatment team.   Patients discharged the day of surgery will not be allowed to drive home, and someone needs to stay with them for 24 hours.  NO VISITORS WILL BE ALLOWED IN PRE-OP WHERE PATIENTS GET READY FOR SURGERY.  ONLY 1 SUPPORT PERSON MAY BE PRESENT IN THE WAITING ROOM WHILE YOU ARE IN SURGERY.  IF YOU ARE TO BE ADMITTED, ONCE YOU ARE IN YOUR ROOM YOU WILL BE ALLOWED TWO (2) VISITORS.  Minor children may have two parents present. Special consideration for safety and communication needs will be reviewed on a case by case basis.   Special instructions:   [place]- Preparing For  Surgery  Before surgery, you can play an important role. Because skin is not sterile, your skin needs to be as free of germs as possible. You can reduce the number of germs on your skin by washing with CHG (chlorahexidine gluconate) Soap before surgery.  CHG is an antiseptic cleaner which kills germs and bonds with the skin to continue killing germs even after washing.    Oral Hygiene is also important to reduce your risk of infection.  Remember - BRUSH YOUR TEETH THE MORNING OF SURGERY WITH YOUR REGULAR TOOTHPASTE  Please do not use if you have an allergy to CHG or antibacterial soaps. If your skin becomes reddened/irritated stop using the CHG.  Do not shave (including legs and underarms) for at least 48 hours prior to first CHG shower. It is OK to shave your face.  Please follow these instructions carefully.   Shower the NIGHT BEFORE SURGERY and the MORNING OF SURGERY  If you chose to wash your hair, wash your hair first as usual with your normal shampoo.  After you shampoo, rinse your hair and body thoroughly to remove the shampoo.  Use CHG Soap as you would any other liquid soap. You can apply CHG directly to the skin and wash gently with a scrungie or a clean washcloth.   Apply the CHG Soap to your body ONLY FROM THE NECK DOWN.  Do not use on open wounds or  open sores. Avoid contact with your eyes, ears, mouth and genitals (private parts). Wash Face and genitals (private parts)  with your normal soap.   Wash thoroughly, paying special attention to the area where your surgery will be performed.  Thoroughly rinse your body with warm water from the neck down.  DO NOT shower/wash with your normal soap after using and rinsing off the CHG Soap.  Pat yourself dry with a CLEAN TOWEL.  Wear CLEAN PAJAMAS to bed the night before surgery  Place CLEAN SHEETS on your bed the night before your surgery  DO NOT SLEEP WITH PETS.   Day of Surgery: Shower with CHG soap. Do not wear  jewelry Do not wear lotions, powders, colognes, or deodorant. Men may shave face and neck. Do not bring valuables to the hospital. [facility] is not responsible for any belongings or valuables. Wear Clean/Comfortable clothing the morning of surgery Remember to brush your teeth WITH YOUR REGULAR TOOTHPASTE.   Please read over the following fact sheets that you were given.   3 days prior to your procedure or After your COVID test   You are not required to quarantine however you are required to wear a well-fitting mask when you are out and around people not in your household. If your mask becomes wet or soiled, replace with a new one.   Wash your hands often with soap and water for 20 seconds or clean your hands with an alcohol-based hand sanitizer that contains at least 60% alcohol.   Do not share personal items.   Notify your provider:  o if you are in close contact with someone who has COVID  o or if you develop a fever of 100.4 or greater, sneezing, cough, sore throat, shortness of breath or body aches.

## 2021-09-15 ENCOUNTER — Other Ambulatory Visit: Payer: Self-pay

## 2021-09-15 ENCOUNTER — Encounter (HOSPITAL_COMMUNITY)
Admission: RE | Admit: 2021-09-15 | Discharge: 2021-09-15 | Disposition: A | Discharge status: Home / Self Care | Payer: Medicare Other | Source: Ambulatory Visit | Attending: Neurological Surgery | Admitting: Neurological Surgery

## 2021-09-15 ENCOUNTER — Encounter (HOSPITAL_COMMUNITY): Payer: Self-pay

## 2021-09-15 VITALS — BP 146/96 | HR 106 | Temp 98.3°F | Resp 17 | Ht 67.0 in | Wt 197.1 lb

## 2021-09-15 DIAGNOSIS — Z01818 Encounter for other preprocedural examination: Secondary | ICD-10-CM

## 2021-09-15 DIAGNOSIS — Z01812 Encounter for preprocedural laboratory examination: Secondary | ICD-10-CM | POA: Insufficient documentation

## 2021-09-15 DIAGNOSIS — Z20822 Contact with and (suspected) exposure to covid-19: Secondary | ICD-10-CM | POA: Insufficient documentation

## 2021-09-15 LAB — CBC
HCT: 47.7 % (ref 39.0–52.0)
Hemoglobin: 15.9 g/dL (ref 13.0–17.0)
MCH: 29.7 pg (ref 26.0–34.0)
MCHC: 33.3 g/dL (ref 30.0–36.0)
MCV: 89.2 fL (ref 80.0–100.0)
Platelets: 231 10*3/uL (ref 150–400)
RBC: 5.35 MIL/uL (ref 4.22–5.81)
RDW: 12.3 % (ref 11.5–15.5)
WBC: 7.4 10*3/uL (ref 4.0–10.5)
nRBC: 0 % (ref 0.0–0.2)

## 2021-09-15 LAB — TYPE AND SCREEN
ABO/RH(D): A POS
Antibody Screen: NEGATIVE

## 2021-09-15 LAB — SURGICAL PCR SCREEN
MRSA, PCR: NEGATIVE
Staphylococcus aureus: NEGATIVE

## 2021-09-15 LAB — SARS CORONAVIRUS 2 (TAT 6-24 HRS): SARS Coronavirus 2: NEGATIVE

## 2021-09-15 NOTE — Progress Notes (Signed)
PCP - Dr. Jerene Bears Cardiologist - denies  PPM/ICD - denies   Chest x-ray - 10/20/16 EKG - 10/16/16 Stress Test - denies ECHO - denies Cardiac Cath - denies  Sleep Study - denies  DM- denies  Blood Thinner Instructions: n/a Aspirin Instructions: pt stopped taking ASA on 1/3 per orders  ERAS Protcol - no, NPO   COVID TEST- 09/15/21 at PAT   Anesthesia review: no  Patient denies shortness of breath, fever, cough and chest pain at PAT appointment   All instructions explained to the patient, with a verbal understanding of the material. Patient agrees to go over the instructions while at home for a better understanding. Patient also instructed to wear a mask in public after being tested for COVID-19. The opportunity to ask questions was provided.

## 2021-09-19 ENCOUNTER — Inpatient Hospital Stay (HOSPITAL_COMMUNITY): Payer: Medicare Other

## 2021-09-19 ENCOUNTER — Inpatient Hospital Stay (HOSPITAL_COMMUNITY)
Admission: RE | Admit: 2021-09-19 | Discharge: 2021-09-20 | DRG: 460 | Disposition: A | Discharge status: Home Health | Payer: Medicare Other | Attending: Neurological Surgery | Admitting: Neurological Surgery

## 2021-09-19 ENCOUNTER — Other Ambulatory Visit: Payer: Self-pay

## 2021-09-19 ENCOUNTER — Encounter (HOSPITAL_COMMUNITY)
Admission: RE | Disposition: A | Discharge status: Home Health | Payer: Self-pay | Source: Home / Self Care | Attending: Neurological Surgery

## 2021-09-19 ENCOUNTER — Encounter (HOSPITAL_COMMUNITY): Payer: Self-pay | Admitting: Neurological Surgery

## 2021-09-19 DIAGNOSIS — Z9079 Acquired absence of other genital organ(s): Secondary | ICD-10-CM

## 2021-09-19 DIAGNOSIS — M4807 Spinal stenosis, lumbosacral region: Secondary | ICD-10-CM | POA: Diagnosis present

## 2021-09-19 DIAGNOSIS — Z419 Encounter for procedure for purposes other than remedying health state, unspecified: Secondary | ICD-10-CM

## 2021-09-19 DIAGNOSIS — Z79899 Other long term (current) drug therapy: Secondary | ICD-10-CM

## 2021-09-19 DIAGNOSIS — M48062 Spinal stenosis, lumbar region with neurogenic claudication: Secondary | ICD-10-CM | POA: Diagnosis present

## 2021-09-19 DIAGNOSIS — M4727 Other spondylosis with radiculopathy, lumbosacral region: Secondary | ICD-10-CM | POA: Diagnosis not present

## 2021-09-19 DIAGNOSIS — M48061 Spinal stenosis, lumbar region without neurogenic claudication: Secondary | ICD-10-CM | POA: Diagnosis present

## 2021-09-19 DIAGNOSIS — Z800 Family history of malignant neoplasm of digestive organs: Secondary | ICD-10-CM | POA: Diagnosis not present

## 2021-09-19 DIAGNOSIS — Z791 Long term (current) use of non-steroidal anti-inflammatories (NSAID): Secondary | ICD-10-CM | POA: Diagnosis not present

## 2021-09-19 DIAGNOSIS — Z981 Arthrodesis status: Secondary | ICD-10-CM

## 2021-09-19 DIAGNOSIS — E785 Hyperlipidemia, unspecified: Secondary | ICD-10-CM | POA: Diagnosis present

## 2021-09-19 DIAGNOSIS — Z808 Family history of malignant neoplasm of other organs or systems: Secondary | ICD-10-CM

## 2021-09-19 DIAGNOSIS — M4726 Other spondylosis with radiculopathy, lumbar region: Secondary | ICD-10-CM | POA: Diagnosis present

## 2021-09-19 DIAGNOSIS — K219 Gastro-esophageal reflux disease without esophagitis: Secondary | ICD-10-CM | POA: Diagnosis present

## 2021-09-19 DIAGNOSIS — M5117 Intervertebral disc disorders with radiculopathy, lumbosacral region: Secondary | ICD-10-CM | POA: Diagnosis present

## 2021-09-19 DIAGNOSIS — Z7982 Long term (current) use of aspirin: Secondary | ICD-10-CM

## 2021-09-19 HISTORY — PX: TRANSFORAMINAL LUMBAR INTERBODY FUSION W/ MIS 1 LEVEL: SHX6145

## 2021-09-19 SURGERY — MINIMALLY INVASIVE (MIS) TRANSFORAMINAL LUMBAR INTERBODY FUSION (TLIF) 1 LEVEL
Anesthesia: General | Site: Back | Laterality: Left

## 2021-09-19 MED ORDER — PHENYLEPHRINE HCL-NACL 20-0.9 MG/250ML-% IV SOLN
INTRAVENOUS | Status: DC | PRN
  Administered 2021-09-19: 50 ug/min via INTRAVENOUS
  Administered 2021-09-19: 45 ug/min via INTRAVENOUS

## 2021-09-19 MED ORDER — GABAPENTIN 100 MG PO CAPS
100.0000 mg | ORAL_CAPSULE | Freq: Every day | ORAL | Status: DC | PRN
  Administered 2021-09-20: 100 mg via ORAL
  Filled 2021-09-19: qty 1

## 2021-09-19 MED ORDER — PHENYLEPHRINE 40 MCG/ML (10ML) SYRINGE FOR IV PUSH (FOR BLOOD PRESSURE SUPPORT)
PREFILLED_SYRINGE | INTRAVENOUS | Status: AC
  Filled 2021-09-19: qty 10

## 2021-09-19 MED ORDER — HYDROMORPHONE HCL 1 MG/ML IJ SOLN: 0.5000 mg | INTRAMUSCULAR | Status: DC | PRN

## 2021-09-19 MED ORDER — CEFAZOLIN SODIUM-DEXTROSE 2-4 GM/100ML-% IV SOLN
INTRAVENOUS | Status: AC
  Filled 2021-09-19: qty 100

## 2021-09-19 MED ORDER — ONDANSETRON HCL 4 MG/2ML IJ SOLN: 4.0000 mg | Freq: Four times a day (QID) | INTRAMUSCULAR | Status: DC | PRN

## 2021-09-19 MED ORDER — SODIUM CHLORIDE 0.9 % IV SOLN: 250.0000 mL | INTRAVENOUS | Status: DC

## 2021-09-19 MED ORDER — LIDOCAINE-EPINEPHRINE 1 %-1:100000 IJ SOLN
INTRAMUSCULAR | Status: DC | PRN
  Administered 2021-09-19: 5 mL

## 2021-09-19 MED ORDER — CEFAZOLIN SODIUM-DEXTROSE 2-3 GM-%(50ML) IV SOLR
INTRAVENOUS | Status: DC | PRN
Start: 2021-09-19 — End: 2021-09-19
  Administered 2021-09-19 (×2): 2 g via INTRAVENOUS

## 2021-09-19 MED ORDER — PROMETHAZINE HCL 25 MG/ML IJ SOLN
INTRAMUSCULAR | Status: AC
  Filled 2021-09-19: qty 1

## 2021-09-19 MED ORDER — DOCUSATE SODIUM 100 MG PO CAPS
100.0000 mg | ORAL_CAPSULE | Freq: Two times a day (BID) | ORAL | Status: DC
  Administered 2021-09-20: 100 mg via ORAL
  Filled 2021-09-19: qty 1

## 2021-09-19 MED ORDER — SODIUM CHLORIDE 0.9% FLUSH: 3.0000 mL | Freq: Two times a day (BID) | INTRAVENOUS | Status: DC

## 2021-09-19 MED ORDER — SUGAMMADEX SODIUM 200 MG/2ML IV SOLN
INTRAVENOUS | Status: DC | PRN
  Administered 2021-09-19: 3 mg via INTRAVENOUS

## 2021-09-19 MED ORDER — CHLORHEXIDINE GLUCONATE 0.12 % MT SOLN
15.0000 mL | Freq: Once | OROMUCOSAL | Status: AC
  Administered 2021-09-19: 15 mL via OROMUCOSAL
  Filled 2021-09-19: qty 15

## 2021-09-19 MED ORDER — ORAL CARE MOUTH RINSE: 15.0000 mL | Freq: Once | OROMUCOSAL | Status: AC

## 2021-09-19 MED ORDER — ONDANSETRON HCL 4 MG PO TABS: 4.0000 mg | ORAL_TABLET | Freq: Four times a day (QID) | ORAL | Status: DC | PRN

## 2021-09-19 MED ORDER — ATORVASTATIN CALCIUM 10 MG PO TABS: 5.0000 mg | ORAL_TABLET | Freq: Every day | ORAL | Status: DC

## 2021-09-19 MED ORDER — CHLORHEXIDINE GLUCONATE CLOTH 2 % EX PADS: 6.0000 | MEDICATED_PAD | Freq: Once | CUTANEOUS | Status: DC

## 2021-09-19 MED ORDER — ONDANSETRON HCL 4 MG/2ML IJ SOLN
INTRAMUSCULAR | Status: AC
  Filled 2021-09-19: qty 2

## 2021-09-19 MED ORDER — EPHEDRINE 5 MG/ML INJ
INTRAVENOUS | Status: AC
  Filled 2021-09-19: qty 5

## 2021-09-19 MED ORDER — PROPOFOL 1000 MG/100ML IV EMUL
INTRAVENOUS | Status: AC
  Filled 2021-09-19: qty 100

## 2021-09-19 MED ORDER — BUPIVACAINE LIPOSOME 1.3 % IJ SUSP
INTRAMUSCULAR | Status: DC | PRN
  Administered 2021-09-19: 20 mL

## 2021-09-19 MED ORDER — MIDAZOLAM HCL 2 MG/2ML IJ SOLN
INTRAMUSCULAR | Status: AC
  Filled 2021-09-19: qty 2

## 2021-09-19 MED ORDER — LIDOCAINE-EPINEPHRINE 1 %-1:100000 IJ SOLN
INTRAMUSCULAR | Status: AC
  Filled 2021-09-19: qty 1

## 2021-09-19 MED ORDER — ACETAMINOPHEN 10 MG/ML IV SOLN
1000.0000 mg | Freq: Once | INTRAVENOUS | Status: DC | PRN
  Administered 2021-09-19: 1000 mg via INTRAVENOUS

## 2021-09-19 MED ORDER — FENTANYL CITRATE (PF) 100 MCG/2ML IJ SOLN
25.0000 ug | INTRAMUSCULAR | Status: DC | PRN
  Administered 2021-09-19: 50 ug via INTRAVENOUS

## 2021-09-19 MED ORDER — THROMBIN 5000 UNITS EX SOLR
CUTANEOUS | Status: AC
  Filled 2021-09-19: qty 10000

## 2021-09-19 MED ORDER — MENTHOL 3 MG MT LOZG: 1.0000 | LOZENGE | OROMUCOSAL | Status: DC | PRN

## 2021-09-19 MED ORDER — SODIUM CHLORIDE 0.9% FLUSH: 3.0000 mL | INTRAVENOUS | Status: DC | PRN

## 2021-09-19 MED ORDER — THROMBIN 5000 UNITS EX SOLR: OROMUCOSAL | Status: DC | PRN

## 2021-09-19 MED ORDER — BUPIVACAINE LIPOSOME 1.3 % IJ SUSP
INTRAMUSCULAR | Status: AC
  Filled 2021-09-19: qty 20

## 2021-09-19 MED ORDER — METHOCARBAMOL 1000 MG/10ML IJ SOLN
500.0000 mg | Freq: Four times a day (QID) | INTRAVENOUS | Status: DC | PRN
  Filled 2021-09-19: qty 5

## 2021-09-19 MED ORDER — THROMBIN 20000 UNITS EX SOLR: CUTANEOUS | Status: DC | PRN

## 2021-09-19 MED ORDER — LIDOCAINE 2% (20 MG/ML) 5 ML SYRINGE
INTRAMUSCULAR | Status: DC | PRN
Start: 2021-09-19 — End: 2021-09-19
  Administered 2021-09-19: 30 mg via INTRAVENOUS

## 2021-09-19 MED ORDER — PHENYLEPHRINE 40 MCG/ML (10ML) SYRINGE FOR IV PUSH (FOR BLOOD PRESSURE SUPPORT)
PREFILLED_SYRINGE | INTRAVENOUS | Status: DC | PRN
Start: 2021-09-19 — End: 2021-09-19
  Administered 2021-09-19 (×3): 80 ug via INTRAVENOUS

## 2021-09-19 MED ORDER — 0.9 % SODIUM CHLORIDE (POUR BTL) OPTIME
TOPICAL | Status: DC | PRN
  Administered 2021-09-19: 1000 mL

## 2021-09-19 MED ORDER — DEXAMETHASONE SODIUM PHOSPHATE 10 MG/ML IJ SOLN
INTRAMUSCULAR | Status: DC | PRN
  Administered 2021-09-19: 10 mg via INTRAVENOUS

## 2021-09-19 MED ORDER — PROPOFOL 500 MG/50ML IV EMUL
INTRAVENOUS | Status: DC | PRN
  Administered 2021-09-19: 35 ug/kg/min via INTRAVENOUS

## 2021-09-19 MED ORDER — CEFAZOLIN SODIUM-DEXTROSE 2-4 GM/100ML-% IV SOLN
2.0000 g | INTRAVENOUS | Status: AC
  Filled 2021-09-19: qty 100

## 2021-09-19 MED ORDER — FENTANYL CITRATE (PF) 250 MCG/5ML IJ SOLN
INTRAMUSCULAR | Status: AC
  Filled 2021-09-19: qty 5

## 2021-09-19 MED ORDER — FENTANYL CITRATE (PF) 100 MCG/2ML IJ SOLN
INTRAMUSCULAR | Status: AC
  Filled 2021-09-19: qty 2

## 2021-09-19 MED ORDER — PROMETHAZINE HCL 25 MG/ML IJ SOLN
6.2500 mg | INTRAMUSCULAR | Status: DC | PRN
  Administered 2021-09-19: 6.25 mg via INTRAVENOUS

## 2021-09-19 MED ORDER — METHOCARBAMOL 500 MG PO TABS
500.0000 mg | ORAL_TABLET | Freq: Four times a day (QID) | ORAL | Status: DC | PRN
  Administered 2021-09-19 – 2021-09-20 (×2): 500 mg via ORAL
  Filled 2021-09-19 (×2): qty 1

## 2021-09-19 MED ORDER — LACTATED RINGERS IV SOLN: INTRAVENOUS | Status: DC

## 2021-09-19 MED ORDER — ROCURONIUM BROMIDE 10 MG/ML (PF) SYRINGE
PREFILLED_SYRINGE | INTRAVENOUS | Status: AC
  Filled 2021-09-19: qty 10

## 2021-09-19 MED ORDER — FENTANYL CITRATE (PF) 250 MCG/5ML IJ SOLN
INTRAMUSCULAR | Status: DC | PRN
  Administered 2021-09-19: 125 ug via INTRAVENOUS
  Administered 2021-09-19: 25 ug via INTRAVENOUS

## 2021-09-19 MED ORDER — SUCCINYLCHOLINE CHLORIDE 200 MG/10ML IV SOSY
PREFILLED_SYRINGE | INTRAVENOUS | Status: AC
  Filled 2021-09-19: qty 10

## 2021-09-19 MED ORDER — HYDROCODONE-ACETAMINOPHEN 10-325 MG PO TABS: 2.0000 | ORAL_TABLET | ORAL | Status: DC | PRN

## 2021-09-19 MED ORDER — ROCURONIUM BROMIDE 10 MG/ML (PF) SYRINGE
PREFILLED_SYRINGE | INTRAVENOUS | Status: DC | PRN
  Administered 2021-09-19: 100 mg via INTRAVENOUS
  Administered 2021-09-19: 20 mg via INTRAVENOUS

## 2021-09-19 MED ORDER — THROMBIN 20000 UNITS EX SOLR
CUTANEOUS | Status: AC
  Filled 2021-09-19: qty 20000

## 2021-09-19 MED ORDER — ACETAMINOPHEN 650 MG RE SUPP: 650.0000 mg | RECTAL | Status: DC | PRN

## 2021-09-19 MED ORDER — HYDROCODONE-ACETAMINOPHEN 10-325 MG PO TABS
1.0000 | ORAL_TABLET | ORAL | Status: DC | PRN
  Administered 2021-09-20: 1 via ORAL
  Filled 2021-09-19: qty 1

## 2021-09-19 MED ORDER — BUPIVACAINE-EPINEPHRINE (PF) 0.5% -1:200000 IJ SOLN
INTRAMUSCULAR | Status: DC | PRN
  Administered 2021-09-19: 5 mL

## 2021-09-19 MED ORDER — ACETAMINOPHEN 10 MG/ML IV SOLN
INTRAVENOUS | Status: AC
  Filled 2021-09-19: qty 100

## 2021-09-19 MED ORDER — ACETAMINOPHEN 325 MG PO TABS: 650.0000 mg | ORAL_TABLET | ORAL | Status: DC | PRN

## 2021-09-19 MED ORDER — ONDANSETRON HCL 4 MG/2ML IJ SOLN
INTRAMUSCULAR | Status: DC | PRN
  Administered 2021-09-19 (×2): 4 mg via INTRAVENOUS

## 2021-09-19 MED ORDER — DEXAMETHASONE SODIUM PHOSPHATE 10 MG/ML IJ SOLN
INTRAMUSCULAR | Status: AC
  Filled 2021-09-19: qty 1

## 2021-09-19 MED ORDER — CEFAZOLIN SODIUM-DEXTROSE 2-4 GM/100ML-% IV SOLN
2.0000 g | Freq: Three times a day (TID) | INTRAVENOUS | Status: DC
  Administered 2021-09-20: 2 g via INTRAVENOUS
  Filled 2021-09-19: qty 100

## 2021-09-19 MED ORDER — LIDOCAINE 2% (20 MG/ML) 5 ML SYRINGE
INTRAMUSCULAR | Status: AC
  Filled 2021-09-19: qty 5

## 2021-09-19 MED ORDER — PHENOL 1.4 % MT LIQD: 1.0000 | OROMUCOSAL | Status: DC | PRN

## 2021-09-19 MED ORDER — BUPIVACAINE-EPINEPHRINE 0.5% -1:200000 IJ SOLN
INTRAMUSCULAR | Status: AC
  Filled 2021-09-19: qty 1

## 2021-09-19 MED ORDER — PHENYLEPHRINE HCL-NACL 20-0.9 MG/250ML-% IV SOLN: INTRAVENOUS | Status: DC | PRN

## 2021-09-19 SURGICAL SUPPLY — 71 items
BAG COUNTER SPONGE SURGICOUNT (BAG) ×2 IMPLANT
BAND RUBBER #18 3X1/16 STRL (MISCELLANEOUS) ×4 IMPLANT
BUR MATCHSTICK NEURO 3.0 LAGG (BURR) ×2 IMPLANT
CAGE POST LUM NL 26X10X9 0D (Cage) ×1 IMPLANT
CNTNR URN SCR LID CUP LEK RST (MISCELLANEOUS) ×1 IMPLANT
CONT SPEC 4OZ STRL OR WHT (MISCELLANEOUS) ×2
COVER BACK TABLE 60X90IN (DRAPES) ×2 IMPLANT
DECANTER SPIKE VIAL GLASS SM (MISCELLANEOUS) ×1 IMPLANT
DERMABOND ADVANCED (GAUZE/BANDAGES/DRESSINGS) ×1
DERMABOND ADVANCED .7 DNX12 (GAUZE/BANDAGES/DRESSINGS) IMPLANT
DRAIN JACKSON RD 7FR 3/32 (WOUND CARE) IMPLANT
DRAPE C-ARM 42X72 X-RAY (DRAPES) ×4 IMPLANT
DRAPE C-ARMOR (DRAPES) ×1 IMPLANT
DRAPE LAPAROTOMY 100X72X124 (DRAPES) ×2 IMPLANT
DRAPE MICROSCOPE LEICA (MISCELLANEOUS) ×2 IMPLANT
DRAPE STERI IOBAN 125X83 (DRAPES) IMPLANT
DRAPE SURG 17X23 STRL (DRAPES) ×1 IMPLANT
DRSG OPSITE POSTOP 4X6 (GAUZE/BANDAGES/DRESSINGS) ×2 IMPLANT
DURAPREP 26ML APPLICATOR (WOUND CARE) ×2 IMPLANT
ELECT BLADE INSULATED 6.5IN (ELECTROSURGICAL) ×2
ELECT REM PT RETURN 9FT ADLT (ELECTROSURGICAL) ×2
ELECTRODE BLDE INSULATED 6.5IN (ELECTROSURGICAL) ×1 IMPLANT
ELECTRODE REM PT RTRN 9FT ADLT (ELECTROSURGICAL) ×1 IMPLANT
EXTENDER TAB GUIDE SV 5.5/6.0 (INSTRUMENTS) ×12 IMPLANT
GAUZE 4X4 16PLY ~~LOC~~+RFID DBL (SPONGE) ×1 IMPLANT
GAUZE SPONGE 4X4 12PLY STRL (GAUZE/BANDAGES/DRESSINGS) ×1 IMPLANT
GLOVE SRG 8 PF TXTR STRL LF DI (GLOVE) ×2 IMPLANT
GLOVE SURG LTX SZ8 (GLOVE) ×4 IMPLANT
GLOVE SURG UNDER POLY LF SZ8 (GLOVE) ×2
GOWN STRL REUS W/ TWL LRG LVL3 (GOWN DISPOSABLE) IMPLANT
GOWN STRL REUS W/ TWL XL LVL3 (GOWN DISPOSABLE) ×1 IMPLANT
GOWN STRL REUS W/TWL 2XL LVL3 (GOWN DISPOSABLE) ×1 IMPLANT
GOWN STRL REUS W/TWL LRG LVL3 (GOWN DISPOSABLE) ×2
GOWN STRL REUS W/TWL XL LVL3 (GOWN DISPOSABLE) ×2
GUIDEWIRE SHARP NITINOL 610 (WIRE) ×6 IMPLANT
HEMOSTAT POWDER KIT SURGIFOAM (HEMOSTASIS) ×2 IMPLANT
KIT BASIN OR (CUSTOM PROCEDURE TRAY) ×2 IMPLANT
KIT INFUSE XX SMALL 0.7CC (Orthopedic Implant) ×1 IMPLANT
KIT POSITION SURG JACKSON T1 (MISCELLANEOUS) ×2 IMPLANT
KIT TURNOVER KIT B (KITS) ×2 IMPLANT
MARKER SKIN DUAL TIP RULER LAB (MISCELLANEOUS) ×2 IMPLANT
NDL ASPIRATION RAN815N 8X15 (NEEDLE) IMPLANT
NDL HYPO 21X1.5 SAFETY (NEEDLE) IMPLANT
NDL HYPO 25X1 1.5 SAFETY (NEEDLE) ×1 IMPLANT
NEEDLE ASPIRATION RAN815N 8X15 (NEEDLE) ×1 IMPLANT
NEEDLE ASPIRATION RANFAC 8X15 (NEEDLE) ×1
NEEDLE HYPO 21X1.5 SAFETY (NEEDLE) ×2 IMPLANT
NEEDLE HYPO 25X1 1.5 SAFETY (NEEDLE) ×2 IMPLANT
NS IRRIG 1000ML POUR BTL (IV SOLUTION) ×2 IMPLANT
PACK LAMINECTOMY NEURO (CUSTOM PROCEDURE TRAY) ×2 IMPLANT
PAD ARMBOARD 7.5X6 YLW CONV (MISCELLANEOUS) ×6 IMPLANT
PATTIES SURGICAL .5 X.5 (GAUZE/BANDAGES/DRESSINGS) IMPLANT
PATTIES SURGICAL .5 X1 (DISPOSABLE) IMPLANT
PATTIES SURGICAL 1X1 (DISPOSABLE) IMPLANT
PUTTY GRAFTON DBF 6CC W/DELIVE (Putty) ×1 IMPLANT
ROD PERC CCM 5.5X60 (Rod) ×2 IMPLANT
SCREW 5.5 VOYAGER MAS 7.5X50 (Screw) ×4 IMPLANT
SCREW MAS VOYAGER 7.5X45 (Screw) ×2 IMPLANT
SCREW SET 5.5/6.0MM SOLERA (Screw) ×6 IMPLANT
SPONGE SURGIFOAM ABS GEL 100 (HEMOSTASIS) ×1 IMPLANT
SPONGE T-LAP 4X18 ~~LOC~~+RFID (SPONGE) ×1 IMPLANT
STAPLER VISISTAT 35W (STAPLE) IMPLANT
SUT VIC AB 0 CT1 27 (SUTURE) ×1
SUT VIC AB 0 CT1 27XBRD ANBCTR (SUTURE) ×1 IMPLANT
SUT VIC AB 2-0 CP2 18 (SUTURE) ×4 IMPLANT
SUT VIC AB 3-0 SH 8-18 (SUTURE) ×2 IMPLANT
SYR 20CC LL (SYRINGE) ×1 IMPLANT
TOWEL GREEN STERILE (TOWEL DISPOSABLE) IMPLANT
TOWEL GREEN STERILE FF (TOWEL DISPOSABLE) ×2 IMPLANT
TRAY FOLEY MTR SLVR 16FR STAT (SET/KITS/TRAYS/PACK) ×2 IMPLANT
WATER STERILE IRR 1000ML POUR (IV SOLUTION) ×2 IMPLANT

## 2021-09-19 NOTE — Transfer of Care (Signed)
22Immediate Anesthesia Transfer of Care Note  Patient: Albert Graham  Procedure(s) Performed: Minimally Invasive Decompression, Transforaminal Lumbar Interbody Fusion Lumbar five-Sacral one-Left, ;Exploration of Fusion with Revision of Hardware Lumbar four-five; Minimally Invasive Decompressive Laminectomy Lumbar three-four (Left: Back)  Patient Location: PACU  Anesthesia Type:General  Level of Consciousness: drowsy  Airway & Oxygen Therapy: Patient Spontanous Breathing and Patient connected to nasal cannula oxygen  Post-op Assessment: Report given to RN and Post -op Vital signs reviewed and stable  Post vital signs: Reviewed and stable  Last Vitals:  Vitals Value Taken Time  BP 98/65 09/19/21 1909  Temp    Pulse 96 09/19/21 1911  Resp 25 09/19/21 1911  SpO2 93 % 09/19/21 1911  Vitals shown include unvalidated device data.  Last Pain:  Vitals:   09/19/21 1005  TempSrc:   PainSc: 4       Patients Stated Pain Goal: 2 (43/56/86 1683)  Complications: No notable events documented.

## 2021-09-19 NOTE — Anesthesia Postprocedure Evaluation (Signed)
Anesthesia Post Note  Patient: Albert Graham  Procedure(s) Performed: Minimally Invasive Decompression, Transforaminal Lumbar Interbody Fusion Lumbar five-Sacral one-Left, ;Exploration of Fusion with Revision of Hardware Lumbar four-five; Minimally Invasive Decompressive Laminectomy Lumbar three-four (Left: Back)     Patient location during evaluation: PACU Anesthesia Type: General Level of consciousness: sedated Pain management: pain level controlled Vital Signs Assessment: post-procedure vital signs reviewed and stable Respiratory status: spontaneous breathing and respiratory function stable Cardiovascular status: stable Postop Assessment: no apparent nausea or vomiting Anesthetic complications: no   No notable events documented.  Last Vitals:  Vitals:   09/19/21 1940 09/19/21 1955  BP: 98/68 105/73  Pulse: 89 99  Resp: 12 (!) 26  Temp:    SpO2: 92% 93%    Last Pain:  Vitals:   09/19/21 1940  TempSrc:   PainSc: [place]

## 2021-09-19 NOTE — H&P (Signed)
Providing Compassionate, Quality Care - Together  NEUROSURGERY HISTORY & PHYSICAL   Albert Graham is an 71 y.o. male.   Chief Complaint: LLE radiculopathy HPI: This is a 71 year old male with a history of low back pain, L4-5 fusion, with worsening progressive left lower extremity radiculopathy.  MRI revealed moderate to severe stenosis at L3-4 centrally as well as left lateral recess and foraminal stenosis at L5-S1 due to adjacent segment disease.  He presents today for L5-S1 TLIF, revision of hardware at L4-5, L3-4 minimally invasive laminectomy.  Past Medical History:  Diagnosis Date   Arthritis    Back pain    Blood clot associated with vein wall inflammation 20 yrs ago   left leg    Cancer (HCC)    prostate    Diverticulitis    Dyslipidemia    Elevated PSA    GERD (gastroesophageal reflux disease)    Hemorrhoids    HOH (hard of hearing)    both ears   Joint pain    Kidney stone    PONV (postoperative nausea and vomiting)    likes phenergan    Past Surgical History:  Procedure Laterality Date   BACK SURGERY  12/31/2013   lower   BACK SURGERY  2019   lumbar fusion   BIOPSY  08/15/2017   Procedure: BIOPSY;  Surgeon: Daneil Dolin, MD;  [facility];  Service: Endoscopy;;  colon   COLONOSCOPY N/A 08/15/2017   Procedure: COLONOSCOPY;  Surgeon: Daneil Dolin, MD;  [facility];  Service: Endoscopy;  Laterality: N/A;  11:45 AM   foot surgery for arthritis Bilateral    left leg tendon cut also   LYMPHADENECTOMY Bilateral 10/18/2016   Procedure: PELVIC LYMPHADENECTOMY;  Surgeon: Raynelle Bring, MD;  Location: [facility];  Service: Urology;  Laterality: Bilateral;   ROBOT ASSISTED LAPAROSCOPIC RADICAL PROSTATECTOMY N/A 10/18/2016   Procedure: XI ROBOTIC ASSISTED LAPAROSCOPIC RADICAL PROSTATECTOMY LEVEL 2;  Surgeon: Raynelle Bring, MD;  Location: [facility];  Service: Urology;  Laterality: N/A;    Family History  Problem Relation Age of Onset   Brain  cancer Mother    Stomach cancer Father    Colon cancer Neg Hx    Social History:  reports that he has never smoked. He has never used smokeless tobacco. He reports that he does not drink alcohol and does not use drugs.  Allergies:  Allergies  Allergen Reactions   Ciprofloxacin Diarrhea   Codeine Nausea And Vomiting   Voltaren [Diclofenac]     Had GI upset    Medications Prior to Admission  Medication Sig Dispense Refill   acetaminophen (TYLENOL) 325 MG tablet Take 650 mg by mouth every 6 (six) hours as needed for mild pain or moderate pain.     aspirin EC 81 MG tablet Take 81 mg by mouth daily.      atorvastatin (LIPITOR) 10 MG tablet Take 5 mg by mouth at bedtime.   0   co-enzyme Q-10 30 MG capsule Take 30 mg by mouth daily.     diclofenac Sodium (VOLTAREN) 1 % GEL Apply 2 g topically 2 (two) times daily as needed (pain).     diphenhydrAMINE (BENADRYL) 25 mg capsule Take 25 mg by mouth every 6 (six) hours as needed (for runny nose/allergies.). Allergie relief     gabapentin (NEURONTIN) 100 MG capsule Take 100 mg by mouth daily as needed (pain).     ketoconazole (NIZORAL) 2 % cream Apply 1 application topically  once a week.     meloxicam (MOBIC) 15 MG tablet Take 15 mg by mouth daily.     Multiple Vitamin (MULTI-VITAMINS) TABS Take 1 tablet by mouth daily.     propranolol (INDERAL) 20 MG tablet Take 1 tablet (20 mg total) by mouth 2 (two) times daily as needed. (Patient taking differently: Take 20 mg by mouth 2 (two) times daily as needed (Hand shaking).) 60 tablet 11   traMADol (ULTRAM) 50 MG tablet Take 50 mg by mouth daily as needed for severe pain.      No results found for this or any previous visit (from the past 48 hour(s)). No results found.  ROS All positives and negatives are listed in HPI above  Blood pressure (!) 147/89, pulse (!) 104, temperature 98.6 F (37 C), temperature source Oral, resp. rate 18, height 5\' 7"  (1.702 m), weight 89.4 kg, SpO2 98 %. Physical Exam   Awake alert oriented x3 PERRLA EOMI Bilateral upper extremity 5/5 Right lower extremity 5/5, left lower extremity 5/5 except for dorsiflexion/plantarflexion 4+/5  Assessment/Plan 71 year old male with  L3-4 moderate to severe central stenosis L5-S1 adjacent segment disease with lateral recess and foraminal stenosis with left lower extremity radiculopathy   -OR today for L3-4 MIS laminectomy, L5-S1 TLIF with revision of fusion/exploration of fusion L4-5.  All risks, benefits and expected outcomes were discussed and agreed upon.  Thank you for allowing me to participate in this patient's care.  Please do not hesitate to call with questions or concerns.   Elwin Sleight, [facility] Cell: [phone]

## 2021-09-19 NOTE — Anesthesia Preprocedure Evaluation (Addendum)
Anesthesia Evaluation  Patient identified by MRN, date of birth, ID band Patient awake    Reviewed: Allergy & Precautions, NPO status , Patient's Chart, lab work & pertinent test results  History of Anesthesia Complications (+) PONV  Airway Mallampati: II  TM Distance: >3 FB Neck ROM: Full    Dental no notable dental hx.    Pulmonary neg pulmonary ROS,    Pulmonary exam normal        Cardiovascular negative cardio ROS   Rhythm:Regular Rate:Normal     Neuro/Psych negative neurological ROS  negative psych ROS   GI/Hepatic Neg liver ROS, GERD  ,  Endo/Other  negative endocrine ROS  Renal/GU Renal disease  negative genitourinary   Musculoskeletal  (+) Arthritis , Osteoarthritis,  Lumbar stenosis   Abdominal Normal abdominal exam  (+)   Peds  Hematology negative hematology ROS (+)   Anesthesia Other Findings   Reproductive/Obstetrics                            Anesthesia Physical Anesthesia Plan  ASA: 2  Anesthesia Plan: General   Post-op Pain Management:    Induction: Intravenous  PONV Risk Score and Plan: 3 and Ondansetron, Dexamethasone and Treatment may vary due to age or medical condition  Airway Management Planned: Mask and Oral ETT  Additional Equipment: None  Intra-op Plan:   Post-operative Plan: Extubation in OR  Informed Consent: I have reviewed the patients History and Physical, chart, labs and discussed the procedure including the risks, benefits and alternatives for the proposed anesthesia with the patient or authorized representative who has indicated his/her understanding and acceptance.     Dental advisory given  Plan Discussed with: CRNA  Anesthesia Plan Comments: (Lab Results      Component                Value               Date                      WBC                      7.4                 09/15/2021                HGB                      15.9                 09/15/2021                HCT                      47.7                09/15/2021                MCV                      89.2                09/15/2021                PLT  231                 09/15/2021           Lab Results      Component                Value               Date                      NA                       142                 07/28/2021                K                        5.3 (H)             07/28/2021                CO2                      23                  07/28/2021                GLUCOSE                  87                  07/28/2021                BUN                      15                  07/28/2021                CREATININE               0.92                07/28/2021                CALCIUM                  10.1                07/28/2021                EGFR                     90                  07/28/2021                GFRNONAA                 >60                 10/20/2016          )       Anesthesia Quick Evaluation

## 2021-09-19 NOTE — Op Note (Addendum)
Providing Compassionate, Quality Care - Together  Date of service: 09/19/2021  PREOP DIAGNOSIS:  Left L5-S1 severe foraminal stenosis with radiculopathy and lumbar spondylosis L3-4 severe canal stenosis with neurogenic claudication   POSTOP DIAGNOSIS: Same   PROCEDURE: Minimally invasive L 5-S1 decompression and transforaminal lumbar interbody fusion and arthrodesis, left sided approach Bilateral L4, L5, S1 segmental pedicle screw instrumentation, Medtronic Solera (left L4 7.5 x 45 mm, left L5 7.5 x 45 mm, left S1 7.5 x 50 mm; right 7.5 x 50 mm L4, right L5 7.5 x 50 mm, right S1 7.5 x 50 mm) Placement of anterior biomechanical device, Medtronic Titan 10 x 26 mm titanium interbody device Bilateral L3, L4 minimally invasive laminectomy for decompression of neural elements Exploration of fusion with revision of hardware, L4-5, bilateral pedicle screws as listed above Intraoperative use of autograft, same incision Intraoperative use of allograft  Intraoperative use of BMP, XXS Intraoperative use of fluoroscopy, greater than 1 hour Intraoperative use of microscope, for microdissection   SURGEON: Dr. Pieter Partridge C. Daxen Lanum, DO   ASSISTANT: Dr. Emelda Brothers, MD: Weston Brass, NP   ANESTHESIA: General Endotracheal   EBL: 200 cc   SPECIMENS: None   DRAINS: None   COMPLICATIONS: None   CONDITION: Hemodynamically stable   HISTORY: Albert Graham is a 71 y.o. y.o. male who initially presented to the outpatient clinic with signs and symptoms consistent with low back pain and left lower extremity L5 radiculopathy as well as neurogenic claudication. MRI demonstrated previous L4-5 PLIF and decompression, with severe stenosis at L3-4 due to ligamentum hypertrophy and facet hypertrophy centrally.  Also at L5-S1 there is a large foraminal disc protrusion causing L5 nerve root compression on the left with degenerative disc disease and spondylosis at this level. Treatment options were discussed  including epidural steroid injections, physical therapy, pain control, he failed conservative measures therefore surgical intervention was offered in the form of an L3-4 minimally invasive laminectomy, exploration of fusion L4-5, L5-S1 decompression and transforaminal lumbar interbody fusion. All risks, benefits and expected outcomes were discussed agreed upon.  Informed consent was obtained.   PROCEDURE IN DETAIL: The patient was brought to the operating room. After induction of general anesthesia, the patient was positioned on the operative table in the prone position on the [place] table. All pressure points were meticulously padded. Skin incision was then marked out and prepped and draped in the usual sterile fashion. Physician driven timeout was performed.   Using biplanar fluoroscopy, the C arm for sterilely draped and brought into the field and the paramedian incisions were planned over the L5-S1 interspace.  Local anesthetic was injected bilaterally.  Using a 10 blade, paramedian incision was created bilaterally.  Using Jamshidi, the bilateral S1 pedicles were accessed under biplanar fluoroscopy.  K wires were placed and the Jamshidi's were removed.  The previous paramedian incisions were opened sharply, sharp dissection was performed down to the lumbodorsal fascia.  This was opened with Bovie electrocautery, the L4 and L5 pedicle screws were identified, removed without difficulty.  K wires were placed in the previous screw hole tracks at L4 bilaterally and L5 bilaterally.  Attention was then turned to docking the Metrix dilator.  Using with a series of dilators, on the patient's left side the facet lamina junction was docked with a 26 x 7 mm tube at L5-S1.  This was confirmed to be in the appropriate trajectory under lateral fluoroscopy.  At this point the microscope was sterilely draped and brought into the field.  Using Bovie electrocautery, soft tissue was cleared from the lamina and facet at  L5-S1, left.  Using a high-speed drill and collecting the autograft, a laminectomy and complete facetectomy was performed down to the ligamentum flavum.  This autograft was saved for later.  The ligamentum flavum was identified to its attachment along the superior lamina and lateral pars.  Using a series of micro curettes, the ligamentum flavum was gently elevated and the epidural space was identified.  The ligamentum flavum was completely resected using Kerrison rongeurs.  The traversing and exiting nerve roots were identified.  Using Kerrison rongeurs, the common dural tube and traversing and exiting nerve roots were completely decompressed from the ligamentum flavum.  They appeared pulsatile and noncompressed.  Camden's triangle was identified, the traversing nerve root was gently retracted medially, and the epidural space was coagulated and cleared of epidural veins.  The disc space was identified.  The disc annulus was then coagulated and incised with an 11 blade.  Using Kerrison rongeurs the annulotomy was widened.  Using a series of disc prep shavers and curettes a radical discectomy was performed to subchondral bleeding bone at both endplates.  The disc base was then trialed and found to be in 10 mm interbody size.  A mixture of autograft and allograft was then placed anteriorly and medially and packed with a bone tamp as well as BMP.  Using a nerve root retractor, the traversing nerve root was gently protected during placement of the interbody.   Using lateral fluoroscopy, the interbody was then placed under live fluoroscopy.  Appropriate placement was identified.  The remainder of the autograft and allograft was then placed laterally and the intervertebral space to the interbody device.  Hemostasis was achieved with passive hemostatics and bipolar cautery.  The traversing nerve root, neural tube and exiting nerve root were followed with a ball-tipped probe and noted to be noncompressed, pulsatile and in  their normal position.  A few pieces of Gelfoam with thrombin were placed over the thecal sac.  Remaining autograft was placed laterally in the lateral gutter.  The Metrix dilator tube was gently removed and hemostasis was achieved with bipolar cautery and the soft tissues.  Attention was then turned to the L3-4 laminectomy.  Using the Metrx dilators, the L3-4 interspace was dilated from the left side and a 20 mm tube was selected.  Lateral fluoroscopy confirmed appropriate placement.  Microscope was brought into the field and the L3 lamina and medial facet was cleared of soft tissue with Bovie electrocautery.  Using a high-speed drill, bilateral laminectomy was performed as well as bilateral medial facetectomies.  Ligamentum flavum was identified.  This was carefully dissected from the thecal sac using microcurette's.  The ligamentum was removed with a series of Kerrison rongeurs.  The thecal sac was identified and epidural space was identified.  It appeared decompressed and pulsatile at this time.  The bilateral lateral recesses were decompressed with Kerrison rongeurs.  Epidural hemostasis was achieved with Surgifoam.  The Metrix dilator tube was removed gently while obtaining hemostasis with bipolar cautery.   The above listed pedicle screws were then placed under lateral fluoroscopy over the K wires bilaterally at L4, L5 and S1.  Appropriate sized rods were measured, contoured and placed percutaneously.  These were confirmed to be within the tulips, setscrews were placed and final tightened to the manufacturer's recommendations.  Percutaneous towers were removed from the screws.  Final AP and lateral fluoroscopy images confirmed appropriate placement of all hardware.  Hemostasis was achieved with bipolar cautery.  The wound was closed in layers, 2-0 and 3-0 Vicryl sutures for the dermis.  Skin was closed with skin glue.  Sterile dressing was applied.   At the end of the case all sponge, needle, and  instrument counts were correct. The patient was then transferred to the stretcher, extubated, and taken to the post-anesthesia care unit in stable hemodynamic condition.

## 2021-09-19 NOTE — Anesthesia Procedure Notes (Signed)
Procedure Name: Intubation Date/Time: 09/19/2021 2:47 PM Performed by: Eligha Bridegroom, CRNA Pre-anesthesia Checklist: Emergency Drugs available, Patient identified, Suction available, Patient being monitored and Timeout performed Patient Re-evaluated:Patient Re-evaluated prior to induction Oxygen Delivery Method: Circle system utilized Preoxygenation: Pre-oxygenation with 100% oxygen Induction Type: IV induction Ventilation: Oral airway inserted - appropriate to patient size Laryngoscope Size: 4 and Mac Tube type: Oral Tube size: 7.5 mm Airway Equipment and Method: Stylet Secured at: 23 cm Tube secured with: Tape Dental Injury: Teeth and Oropharynx as per pre-operative assessment

## 2021-09-20 ENCOUNTER — Encounter (HOSPITAL_COMMUNITY): Payer: Self-pay | Admitting: Neurological Surgery

## 2021-09-20 MED ORDER — METHOCARBAMOL 750 MG PO TABS: 750.0000 mg | ORAL_TABLET | Freq: Four times a day (QID) | ORAL | 1 refills | Status: AC

## 2021-09-20 MED ORDER — TRAMADOL HCL 50 MG PO TABS: 50.0000 mg | ORAL_TABLET | Freq: Four times a day (QID) | ORAL | 0 refills | Status: AC | PRN

## 2021-09-20 MED ORDER — ALUM & MAG HYDROXIDE-SIMETH 200-200-20 MG/5ML PO SUSP
30.0000 mL | Freq: Four times a day (QID) | ORAL | Status: DC | PRN
  Administered 2021-09-20: 30 mL via ORAL
  Filled 2021-09-20: qty 30

## 2021-09-20 NOTE — Evaluation (Signed)
Physical Therapy Evaluation Patient Details Name: Albert Graham MRN: [id number] DOB: Jan 22, 1951 Today's Date: 09/20/2021  History of Present Illness  Pt is a 71 y/o male who presents s/p L5-S1 TLIF on 09/19/2021. PMH significant for prostate CA, diverticulitis, lumbar surgery 2015, lumbar fusion 2019.   Clinical Impression  Pt admitted with above diagnosis. At the time of PT eval, pt was able to demonstrate transfers and ambulation with up to min guard assist and RW for support. Pt's biggest complaint is bilateral hip pain. Ice packs provided at end of session. Pt was educated on precautions, positioning recommendations, appropriate activity progression, and car transfer. Pt currently with functional limitations due to the deficits listed below (see PT Problem List). Pt will benefit from skilled PT to increase their independence and safety with mobility to allow discharge to the venue listed below.         Recommendations for follow up therapy are one component of a multi-disciplinary discharge planning process, led by the attending physician.  Recommendations may be updated based on patient status, additional functional criteria and insurance authorization.  Follow Up Recommendations Home health PT    Assistance Recommended at Discharge PRN  Patient can return home with the following  A little help with walking and/or transfers;A little help with bathing/dressing/bathroom;Assist for transportation;Help with stairs or ramp for entrance    Equipment Recommendations None recommended by PT  Recommendations for Other Services       Functional Status Assessment Patient has had a recent decline in their functional status and demonstrates the ability to make significant improvements in function in a reasonable and predictable amount of time.     Precautions / Restrictions Precautions Precautions: Back;Fall Precaution Booklet Issued: Yes (comment) Precaution Comments: Reviewed handout and pt was  cued for precautions during functional mobility. Spinal Brace:  (No brace needed order) Restrictions Weight Bearing Restrictions: No      Mobility  Bed Mobility Overal bed mobility: Modified Independent             General bed mobility comments: Sit>supine. No assist required. HOB flat and min use of rails required. Overall good log roll technique.    Transfers Overall transfer level: Needs assistance Equipment used: Rolling walker (2 wheels) Transfers: Sit to/from Stand Sit to Stand: Supervision           General transfer comment: VC's for improved posture during power up to full stand. No overt LOB noted.    Ambulation/Gait Ambulation/Gait assistance: Supervision Gait Distance (Feet): 300 Feet Assistive device: Rolling walker (2 wheels) Gait Pattern/deviations: Step-through pattern;Decreased stride length;Trunk flexed Gait velocity: Decreased Gait velocity interpretation: 1.31 - 2.62 ft/sec, indicative of limited community ambulator   General Gait Details: Frequent VC's for improved posture, closer walker proximity, and forward gaze. No assist required.  Stairs Stairs:  (Deferred stair training due to bilateral hip pain.)          Wheelchair Mobility    Modified Rankin (Stroke Patients Only)       Balance Overall balance assessment: Needs assistance Sitting-balance support: Feet supported;No upper extremity supported Sitting balance-Leahy Scale: Fair     Standing balance support: No upper extremity supported;Reliant on assistive device for balance Standing balance-Leahy Scale: Fair Standing balance comment: Dynamically                             Pertinent Vitals/Pain Pain Assessment: 0-10 Pain Score: 7  Pain Location: Bilateral hips and back (incision  site) Pain Descriptors / Indicators: Operative site guarding;Sore Pain Intervention(s): Limited activity within patient's tolerance;Monitored during session;Repositioned    Home  Living Family/patient expects to be discharged to:: Private residence Living Arrangements: Spouse/significant other Available Help at Discharge: Family;Available 24 hours/day Type of Home: House Home Access: Stairs to enter   [employer] of Steps: 2   Home Layout: One level Home Equipment: Conservation officer, nature (2 wheels);Rollator (4 wheels);Cane - single point      Prior Function Prior Level of Function : Independent/Modified Independent                     Hand Dominance        Extremity/Trunk Assessment   Upper Extremity Assessment Upper Extremity Assessment: Defer to OT evaluation    Lower Extremity Assessment Lower Extremity Assessment: Generalized weakness (Decreased strength and AROM consistent with pre-op diagnosis)    Cervical / Trunk Assessment Cervical / Trunk Assessment: Back Surgery  Communication      Cognition Arousal/Alertness: Awake/alert Behavior During Therapy: WFL for tasks assessed/performed Overall Cognitive Status: Within Functional Limits for tasks assessed                                          General Comments      Exercises     Assessment/Plan    PT Assessment Patient needs continued PT services  PT Problem List Decreased strength;Decreased activity tolerance;Decreased balance;Decreased mobility;Decreased knowledge of use of DME;Decreased safety awareness;Decreased knowledge of precautions;Pain       PT Treatment Interventions DME instruction;Stair training;Gait training;Functional mobility training;Therapeutic activities;Therapeutic exercise;Neuromuscular re-education;Patient/family education    PT Goals (Current goals can be found in the Care Plan section)  Acute Rehab PT Goals Patient Stated Goal: Decrease pain in hips PT Goal Formulation: With patient/family Time For Goal Achievement: 09/27/21 Potential to Achieve Goals: Good    Frequency Min 5X/week     Co-evaluation                AM-PAC PT "6 Clicks" Mobility  Outcome Measure Help needed turning from your back to your side while in a flat bed without using bedrails?: None Help needed moving from lying on your back to sitting on the side of a flat bed without using bedrails?: None Help needed moving to and from a bed to a chair (including a wheelchair)?: A Little Help needed standing up from a chair using your arms (e.g., wheelchair or bedside chair)?: A Little Help needed to walk in hospital room?: A Little Help needed climbing 3-5 steps with a railing? : A Little 6 Click Score: 20    End of Session Equipment Utilized During Treatment: Gait belt Activity Tolerance: Patient tolerated treatment well Patient left: in bed;with call bell/phone within reach;with family/visitor present Nurse Communication: Mobility status PT Visit Diagnosis: Unsteadiness on feet (R26.81);Pain Pain - Right/Left:  (Bilaterally) Pain - part of body: Hip (and back)    Time: 6195-0932 PT Time Calculation (min) (ACUTE ONLY): 20 min   Charges:   PT Evaluation $PT Eval Low Complexity: 1 Low          Rolinda Roan, PT, DPT Acute Rehabilitation Services Pager: [phone] Office: [phone]   Thelma Comp 09/20/2021, 11:53 AM

## 2021-09-20 NOTE — Discharge Summary (Signed)
Physician Discharge Summary  Patient ID: Albert Graham MRN: [id number] DOB/AGE: [birth year] 71 y.o.  Admit date: 09/19/2021 Discharge date: 09/20/2021  Admission Diagnoses:  Left L5-S1 severe foraminal stenosis with radiculopathy and lumbar spondylosis L3-4 severe canal stenosis with neurogenic claudication  Discharge Diagnoses:  Left L5-S1 severe foraminal stenosis with radiculopathy and lumbar spondylosis L3-4 severe canal stenosis with neurogenic claudication  Principal Problem:   Lumbar foraminal stenosis   Discharged Condition: good  Hospital Course: The patient was admitted on 09/19/2021 and taken to the operating room where the patient underwent L5-S1 TLIF, revision of hardware at L4-5, L3-4 minimally invasive laminectomy. The patient tolerated the procedure well and was taken to the recovery room and then to the floor in stable condition. The hospital course was routine. There were no complications. The wound remained clean dry and intact. Pt had appropriate back soreness. No complaints of leg pain or new N/T/W. The patient remained afebrile with stable vital signs, and tolerated a regular diet. The patient continued to increase activities, and pain was well controlled with oral pain medications.   Consults: None  Significant Diagnostic Studies: radiology: X-Ray: intraoperative   Treatments: surgery:  Minimally invasive L 5-S1 decompression and transforaminal lumbar interbody fusion and arthrodesis, left sided approach Bilateral L4, L5, S1 segmental pedicle screw instrumentation, Medtronic Solera (left L4 7.5 x 45 mm, left L5 7.5 x 45 mm, left S1 7.5 x 50 mm; right 7.5 x 50 mm L4, right L5 7.5 x 50 mm, right S1 7.5 x 50 mm) Placement of anterior biomechanical device, Medtronic Titan 10 x 26 mm titanium interbody device Exploration of fusion with revision of hardware, L4-5, bilateral pedicle screws as listed above Intraoperative use of autograft, same incision Intraoperative use  of allograft  Intraoperative use of BMP, extra EXTR small Intraoperative use of fluoroscopy, greater than 1 hour Intraoperative use of microscope, for microdissection    Discharge Exam: Blood pressure 118/74, pulse 88, temperature 98.7 F (37.1 C), temperature source Oral, resp. rate 18, height 5\' 7"  (1.702 m), weight 89.4 kg, SpO2 95 %. Physical Exam: Patient is awake, A/O X 4, conversant, and in good spirits. They are in NAD and VSS. Doing well. Speech is fluent and appropriate. MAEW with good strength that is symmetric bilaterally. 5/5 BUE/BLE. Sensation to light touch is intact. PERLA, EOMI. CNs grossly intact. Dressings are clean dry intact. Incisions are well approximated with no drainage, erythema, or edema.       Disposition: Discharge disposition: 01-Home or Self Care       Discharge Instructions     Incentive spirometry RT   Complete by: As directed       Allergies as of 09/20/2021       Reactions   Ciprofloxacin Diarrhea   Codeine Nausea And Vomiting   Voltaren [diclofenac]    Had GI upset        Medication List     STOP taking these medications    diclofenac Sodium 1 % Gel Commonly known as: VOLTAREN   meloxicam 15 MG tablet Commonly known as: MOBIC       TAKE these medications    acetaminophen 325 MG tablet Commonly known as: TYLENOL Take 650 mg by mouth every 6 (six) hours as needed for mild pain or moderate pain.   aspirin EC 81 MG tablet Take 81 mg by mouth daily.   atorvastatin 10 MG tablet Commonly known as: LIPITOR Take 5 mg by mouth at bedtime.   co-enzyme Q-10 30 MG  capsule Take 30 mg by mouth daily.   diphenhydrAMINE 25 mg capsule Commonly known as: BENADRYL Take 25 mg by mouth every 6 (six) hours as needed (for runny nose/allergies.). Allergie relief   gabapentin 100 MG capsule Commonly known as: NEURONTIN Take 100 mg by mouth daily as needed (pain).   ketoconazole 2 % cream Commonly known as: NIZORAL Apply 1  application topically once a week.   methocarbamol 750 MG tablet Commonly known as: Robaxin-750 Take 1 tablet (750 mg total) by mouth 4 (four) times daily.   Multi-Vitamins Tabs Take 1 tablet by mouth daily.   propranolol 20 MG tablet Commonly known as: INDERAL Take 1 tablet (20 mg total) by mouth 2 (two) times daily as needed. What changed: reasons to take this   traMADol 50 MG tablet Commonly known as: Ultram Take 1-2 tablets (50-100 mg total) by mouth every 6 (six) hours as needed. What changed:  how much to take when to take this reasons to take this         Signed: Marvis Moeller, DNP, NP-C 09/20/2021, 8:16 AM

## 2021-09-20 NOTE — TOC Transition Note (Signed)
Transition of Care [facility]) - CM/SW Discharge Note   Patient Details  Name: Albert Graham MRN: [id number] Date of Birth: Jan 01, 1951  Transition of Care [facility]) CM/SW Contact:  Albert Collet, RN Phone Number: 09/20/2021, 10:21 AM   Clinical Narrative:    Spoke w patient and wife at bedside.  They are agreeable to [facility] services, they have used [facility] in the past. Al[facility] Lemmings able to accept. Nursing staff to provide any DME needed. No other CM needs identified    Final next level of care: Home w Home Health Services Barriers to Discharge: No Barriers Identified   Patient Goals and CMS Choice Patient states their goals for this hospitalization and ongoing recovery are:: to go home CMS Medicare.gov Compare Post Acute Care list provided to:: Patient Choice offered to / list presented to : Patient  Discharge Placement                       Discharge Plan and Services                          HH Arranged: PT, OT [facility] Agency: [place] Date [place]: 09/20/21 Time [place]: 1021 Representative spoke with at [place]: [place] ([place]) Interventions     Readmission Risk Interventions No flowsheet data found.

## 2021-09-20 NOTE — Plan of Care (Signed)

## 2021-09-20 NOTE — Evaluation (Signed)
Occupational Therapy Evaluation Patient Details Name: Albert GONNELLA MRN: [id number] DOB: 12/02/1950 Today's Date: 09/20/2021   History of Present Illness Pt is a 71 y/o male who presents s/p L5-S1 TLIF on 09/19/2021. PMH significant for prostate CA, diverticulitis, lumbar surgery 2015, lumbar fusion 2019.   Clinical Impression   Pt admitted for procedure listed above. PTA pt reported that he was independent with all ADL's and IADL's, however was quite limited by pain and weakness at times. At this time, pt reports increased pain, however he was able to complete dressing, toileting, and grooming with no assist. HE was educated on compensatory strategies for BADL's and had no further questions or needs. Acute OT will sign off at this time.       Recommendations for follow up therapy are one component of a multi-disciplinary discharge planning process, led by the attending physician.  Recommendations may be updated based on patient status, additional functional criteria and insurance authorization.   Follow Up Recommendations  No OT follow up    Assistance Recommended at Discharge PRN  Patient can return home with the following A little help with bathing/dressing/bathroom    Functional Status Assessment  Patient has had a recent decline in their functional status and demonstrates the ability to make significant improvements in function in a reasonable and predictable amount of time.  Equipment Recommendations  None recommended by OT    Recommendations for Other Services       Precautions / Restrictions Precautions Precautions: Back;Fall Precaution Booklet Issued: Yes (comment) Precaution Comments: Reviewed handout and pt was cued for precautions during functional mobility. Spinal Brace:  (No brace needed order) Restrictions Weight Bearing Restrictions: No      Mobility Bed Mobility Overal bed mobility: Modified Independent             General bed mobility comments:  Sit>supine. No assist required. HOB flat and min use of rails required. Overall good log roll technique.    Transfers Overall transfer level: Needs assistance Equipment used: Rolling walker (2 wheels) Transfers: Sit to/from Stand Sit to Stand: Supervision           General transfer comment: VC's for improved posture during power up to full stand. No overt LOB noted.      Balance Overall balance assessment: Needs assistance Sitting-balance support: Feet supported;No upper extremity supported Sitting balance-Leahy Scale: Fair     Standing balance support: No upper extremity supported;Reliant on assistive device for balance Standing balance-Leahy Scale: Fair Standing balance comment: Dynamically                           ADL either performed or assessed with clinical judgement   ADL Overall ADL's : Modified independent                                       General ADL Comments: Overall at baseline, may require assist for LB ADL's when pain is increased and/or pt is fatigued initially. Wife reports she will be able to provide assist.     Vision Baseline Vision/History: 1 Wears glasses Ability to See in Adequate Light: 0 Adequate Patient Visual Report: No change from baseline Vision Assessment?: No apparent visual deficits     Perception     Praxis      Pertinent Vitals/Pain Pain Assessment: 0-10 Pain Score: 8  Pain Location: Bilateral hips and  back (incision site) Pain Descriptors / Indicators: Operative site guarding;Sore Pain Intervention(s): Monitored during session;Limited activity within patient's tolerance;Repositioned     Hand Dominance Right   Extremity/Trunk Assessment Upper Extremity Assessment Upper Extremity Assessment: Overall WFL for tasks assessed   Lower Extremity Assessment Lower Extremity Assessment: Defer to PT evaluation   Cervical / Trunk Assessment Cervical / Trunk Assessment: Back Surgery   Communication  Communication Communication: No difficulties   Cognition Arousal/Alertness: Awake/alert Behavior During Therapy: WFL for tasks assessed/performed Overall Cognitive Status: Within Functional Limits for tasks assessed                                       General Comments  Honeycomb dressings intact and dry    Exercises     Shoulder Instructions      Home Living Family/patient expects to be discharged to:: Private residence Living Arrangements: Spouse/significant other Available Help at Discharge: Family;Available 24 hours/day Type of Home: House Home Access: Stairs to enter [employer] of Steps: 2   Home Layout: One level     Bathroom Shower/Tub: Teacher, early years/pre: Standard Bathroom Accessibility: Yes   Home Equipment: Conservation officer, nature (2 wheels);Rollator (4 wheels);Cane - single point   Additional Comments: Pt's wife reports that neighbor can provide [place]      Prior Functioning/Environment Prior Level of Function : Independent/Modified Independent                        OT Problem List: Decreased strength;Decreased activity tolerance;Impaired balance (sitting and/or standing);Decreased knowledge of use of DME or AE;Pain      OT Treatment/Interventions:      OT Goals(Current goals can be found in the care plan section) Acute Rehab OT Goals Patient Stated Goal: To go home OT Goal Formulation: With patient Time For Goal Achievement: 09/20/21 Potential to Achieve Goals: Good  OT Frequency:      Co-evaluation              AM-PAC OT "6 Clicks" Daily Activity     Outcome Measure Help from another person eating meals?: None Help from another person taking care of personal grooming?: None Help from another person toileting, which includes using toliet, bedpan, or urinal?: None Help from another person bathing (including washing, rinsing, drying)?: None Help from another person to put on and taking off regular  upper body clothing?: None Help from another person to put on and taking off regular lower body clothing?: None 6 Click Score: 24   End of Session Nurse Communication: Mobility status  Activity Tolerance: Patient tolerated treatment well Patient left: in bed;with call bell/phone within reach;with family/visitor present  OT Visit Diagnosis: Unsteadiness on feet (R26.81);Other abnormalities of gait and mobility (R26.89);Muscle weakness (generalized) (M62.81)                Time: 8119-1478 OT Time Calculation (min): 23 min Charges:  OT General Charges $OT Visit: 1 Visit OT Evaluation $OT Eval Moderate Complexity: 1 Mod OT Treatments $Self Care/Home Management : 8-22 mins  Ryun Velez H., OTR/L Acute Rehabilitation  Adalina Dopson Elane Azayla Polo 09/20/2021, 12:21 PM

## 2021-09-20 NOTE — Discharge Instructions (Signed)

## 2021-09-20 NOTE — Progress Notes (Signed)
Patient awaiting transport via wheelchair by volunteer for discharge home; in no acute distress nor complaints of pain nor discomfort; incision on his back with honeycomb dressing and is clean, dry and intact; room was checked and accounted for all his belongings; discharge instructions concerning his medications, incision care, follow up appointment and when to call the doctor as needed were all discussed with patient and his wife by RN and both expressed understanding on the instructions given.

## 2021-09-22 DIAGNOSIS — Z9181 History of falling: Secondary | ICD-10-CM | POA: Diagnosis not present

## 2021-09-22 DIAGNOSIS — Z981 Arthrodesis status: Secondary | ICD-10-CM | POA: Diagnosis not present

## 2021-09-22 DIAGNOSIS — I10 Essential (primary) hypertension: Secondary | ICD-10-CM | POA: Diagnosis not present

## 2021-09-22 DIAGNOSIS — Z4789 Encounter for other orthopedic aftercare: Secondary | ICD-10-CM | POA: Diagnosis not present

## 2021-09-22 DIAGNOSIS — Z8546 Personal history of malignant neoplasm of prostate: Secondary | ICD-10-CM | POA: Diagnosis not present

## 2021-09-22 DIAGNOSIS — K5790 Diverticulosis of intestine, part unspecified, without perforation or abscess without bleeding: Secondary | ICD-10-CM | POA: Diagnosis not present

## 2021-09-27 DIAGNOSIS — Z8546 Personal history of malignant neoplasm of prostate: Secondary | ICD-10-CM | POA: Diagnosis not present

## 2021-09-27 DIAGNOSIS — I10 Essential (primary) hypertension: Secondary | ICD-10-CM | POA: Diagnosis not present

## 2021-09-27 DIAGNOSIS — K5790 Diverticulosis of intestine, part unspecified, without perforation or abscess without bleeding: Secondary | ICD-10-CM | POA: Diagnosis not present

## 2021-09-27 DIAGNOSIS — Z9181 History of falling: Secondary | ICD-10-CM | POA: Diagnosis not present

## 2021-09-27 DIAGNOSIS — Z981 Arthrodesis status: Secondary | ICD-10-CM | POA: Diagnosis not present

## 2021-09-27 DIAGNOSIS — Z4789 Encounter for other orthopedic aftercare: Secondary | ICD-10-CM | POA: Diagnosis not present

## 2021-09-29 DIAGNOSIS — Z4789 Encounter for other orthopedic aftercare: Secondary | ICD-10-CM | POA: Diagnosis not present

## 2021-09-29 DIAGNOSIS — K5790 Diverticulosis of intestine, part unspecified, without perforation or abscess without bleeding: Secondary | ICD-10-CM | POA: Diagnosis not present

## 2021-09-29 DIAGNOSIS — I10 Essential (primary) hypertension: Secondary | ICD-10-CM | POA: Diagnosis not present

## 2021-09-29 DIAGNOSIS — Z9181 History of falling: Secondary | ICD-10-CM | POA: Diagnosis not present

## 2021-09-29 DIAGNOSIS — Z8546 Personal history of malignant neoplasm of prostate: Secondary | ICD-10-CM | POA: Diagnosis not present

## 2021-09-29 DIAGNOSIS — Z981 Arthrodesis status: Secondary | ICD-10-CM | POA: Diagnosis not present

## 2021-10-02 DIAGNOSIS — Z8546 Personal history of malignant neoplasm of prostate: Secondary | ICD-10-CM | POA: Diagnosis not present

## 2021-10-02 DIAGNOSIS — K5790 Diverticulosis of intestine, part unspecified, without perforation or abscess without bleeding: Secondary | ICD-10-CM | POA: Diagnosis not present

## 2021-10-02 DIAGNOSIS — I10 Essential (primary) hypertension: Secondary | ICD-10-CM | POA: Diagnosis not present

## 2021-10-02 DIAGNOSIS — Z9181 History of falling: Secondary | ICD-10-CM | POA: Diagnosis not present

## 2021-10-02 DIAGNOSIS — Z981 Arthrodesis status: Secondary | ICD-10-CM | POA: Diagnosis not present

## 2021-10-02 DIAGNOSIS — Z4789 Encounter for other orthopedic aftercare: Secondary | ICD-10-CM | POA: Diagnosis not present

## 2021-10-03 DIAGNOSIS — I10 Essential (primary) hypertension: Secondary | ICD-10-CM | POA: Diagnosis not present

## 2021-10-03 DIAGNOSIS — Z981 Arthrodesis status: Secondary | ICD-10-CM | POA: Diagnosis not present

## 2021-10-03 DIAGNOSIS — K5790 Diverticulosis of intestine, part unspecified, without perforation or abscess without bleeding: Secondary | ICD-10-CM | POA: Diagnosis not present

## 2021-10-03 DIAGNOSIS — Z4789 Encounter for other orthopedic aftercare: Secondary | ICD-10-CM | POA: Diagnosis not present

## 2021-10-03 DIAGNOSIS — Z9181 History of falling: Secondary | ICD-10-CM | POA: Diagnosis not present

## 2021-10-03 DIAGNOSIS — Z8546 Personal history of malignant neoplasm of prostate: Secondary | ICD-10-CM | POA: Diagnosis not present

## 2021-10-06 DIAGNOSIS — Z9181 History of falling: Secondary | ICD-10-CM | POA: Diagnosis not present

## 2021-10-06 DIAGNOSIS — Z981 Arthrodesis status: Secondary | ICD-10-CM | POA: Diagnosis not present

## 2021-10-06 DIAGNOSIS — I10 Essential (primary) hypertension: Secondary | ICD-10-CM | POA: Diagnosis not present

## 2021-10-06 DIAGNOSIS — Z4789 Encounter for other orthopedic aftercare: Secondary | ICD-10-CM | POA: Diagnosis not present

## 2021-10-06 DIAGNOSIS — Z8546 Personal history of malignant neoplasm of prostate: Secondary | ICD-10-CM | POA: Diagnosis not present

## 2021-10-06 DIAGNOSIS — K5790 Diverticulosis of intestine, part unspecified, without perforation or abscess without bleeding: Secondary | ICD-10-CM | POA: Diagnosis not present

## 2021-10-10 DIAGNOSIS — Z4789 Encounter for other orthopedic aftercare: Secondary | ICD-10-CM | POA: Diagnosis not present

## 2021-10-10 DIAGNOSIS — Z9181 History of falling: Secondary | ICD-10-CM | POA: Diagnosis not present

## 2021-10-10 DIAGNOSIS — Z981 Arthrodesis status: Secondary | ICD-10-CM | POA: Diagnosis not present

## 2021-10-10 DIAGNOSIS — E7800 Pure hypercholesterolemia, unspecified: Secondary | ICD-10-CM | POA: Diagnosis not present

## 2021-10-10 DIAGNOSIS — Z8546 Personal history of malignant neoplasm of prostate: Secondary | ICD-10-CM | POA: Diagnosis not present

## 2021-10-10 DIAGNOSIS — K5790 Diverticulosis of intestine, part unspecified, without perforation or abscess without bleeding: Secondary | ICD-10-CM | POA: Diagnosis not present

## 2021-10-10 DIAGNOSIS — I10 Essential (primary) hypertension: Secondary | ICD-10-CM | POA: Diagnosis not present

## 2021-10-12 DIAGNOSIS — Z20822 Contact with and (suspected) exposure to covid-19: Secondary | ICD-10-CM | POA: Diagnosis not present

## 2021-10-13 DIAGNOSIS — I10 Essential (primary) hypertension: Secondary | ICD-10-CM | POA: Diagnosis not present

## 2021-10-13 DIAGNOSIS — K5790 Diverticulosis of intestine, part unspecified, without perforation or abscess without bleeding: Secondary | ICD-10-CM | POA: Diagnosis not present

## 2021-10-13 DIAGNOSIS — Z8546 Personal history of malignant neoplasm of prostate: Secondary | ICD-10-CM | POA: Diagnosis not present

## 2021-10-13 DIAGNOSIS — Z4789 Encounter for other orthopedic aftercare: Secondary | ICD-10-CM | POA: Diagnosis not present

## 2021-10-13 DIAGNOSIS — Z981 Arthrodesis status: Secondary | ICD-10-CM | POA: Diagnosis not present

## 2021-10-13 DIAGNOSIS — Z9181 History of falling: Secondary | ICD-10-CM | POA: Diagnosis not present

## 2021-10-18 DIAGNOSIS — Z4789 Encounter for other orthopedic aftercare: Secondary | ICD-10-CM | POA: Diagnosis not present

## 2021-10-18 DIAGNOSIS — Z981 Arthrodesis status: Secondary | ICD-10-CM | POA: Diagnosis not present

## 2021-10-18 DIAGNOSIS — K5790 Diverticulosis of intestine, part unspecified, without perforation or abscess without bleeding: Secondary | ICD-10-CM | POA: Diagnosis not present

## 2021-10-18 DIAGNOSIS — Z9181 History of falling: Secondary | ICD-10-CM | POA: Diagnosis not present

## 2021-10-18 DIAGNOSIS — I10 Essential (primary) hypertension: Secondary | ICD-10-CM | POA: Diagnosis not present

## 2021-10-18 DIAGNOSIS — Z8546 Personal history of malignant neoplasm of prostate: Secondary | ICD-10-CM | POA: Diagnosis not present

## 2021-10-20 DIAGNOSIS — Z4789 Encounter for other orthopedic aftercare: Secondary | ICD-10-CM | POA: Diagnosis not present

## 2021-10-20 DIAGNOSIS — I10 Essential (primary) hypertension: Secondary | ICD-10-CM | POA: Diagnosis not present

## 2021-10-20 DIAGNOSIS — Z9181 History of falling: Secondary | ICD-10-CM | POA: Diagnosis not present

## 2021-10-20 DIAGNOSIS — Z981 Arthrodesis status: Secondary | ICD-10-CM | POA: Diagnosis not present

## 2021-10-20 DIAGNOSIS — K5790 Diverticulosis of intestine, part unspecified, without perforation or abscess without bleeding: Secondary | ICD-10-CM | POA: Diagnosis not present

## 2021-10-20 DIAGNOSIS — Z8546 Personal history of malignant neoplasm of prostate: Secondary | ICD-10-CM | POA: Diagnosis not present

## 2021-10-22 DIAGNOSIS — I10 Essential (primary) hypertension: Secondary | ICD-10-CM | POA: Diagnosis not present

## 2021-10-22 DIAGNOSIS — Z4789 Encounter for other orthopedic aftercare: Secondary | ICD-10-CM | POA: Diagnosis not present

## 2021-10-22 DIAGNOSIS — Z981 Arthrodesis status: Secondary | ICD-10-CM | POA: Diagnosis not present

## 2021-10-22 DIAGNOSIS — Z8546 Personal history of malignant neoplasm of prostate: Secondary | ICD-10-CM | POA: Diagnosis not present

## 2021-10-22 DIAGNOSIS — Z9181 History of falling: Secondary | ICD-10-CM | POA: Diagnosis not present

## 2021-10-22 DIAGNOSIS — K5790 Diverticulosis of intestine, part unspecified, without perforation or abscess without bleeding: Secondary | ICD-10-CM | POA: Diagnosis not present

## 2021-10-25 DIAGNOSIS — I10 Essential (primary) hypertension: Secondary | ICD-10-CM | POA: Diagnosis not present

## 2021-10-25 DIAGNOSIS — K5790 Diverticulosis of intestine, part unspecified, without perforation or abscess without bleeding: Secondary | ICD-10-CM | POA: Diagnosis not present

## 2021-10-25 DIAGNOSIS — Z4789 Encounter for other orthopedic aftercare: Secondary | ICD-10-CM | POA: Diagnosis not present

## 2021-10-25 DIAGNOSIS — Z8546 Personal history of malignant neoplasm of prostate: Secondary | ICD-10-CM | POA: Diagnosis not present

## 2021-10-25 DIAGNOSIS — Z9181 History of falling: Secondary | ICD-10-CM | POA: Diagnosis not present

## 2021-10-25 DIAGNOSIS — Z981 Arthrodesis status: Secondary | ICD-10-CM | POA: Diagnosis not present

## 2021-10-30 DIAGNOSIS — Z20822 Contact with and (suspected) exposure to covid-19: Secondary | ICD-10-CM | POA: Diagnosis not present

## 2021-11-01 DIAGNOSIS — K5790 Diverticulosis of intestine, part unspecified, without perforation or abscess without bleeding: Secondary | ICD-10-CM | POA: Diagnosis not present

## 2021-11-01 DIAGNOSIS — Z8546 Personal history of malignant neoplasm of prostate: Secondary | ICD-10-CM | POA: Diagnosis not present

## 2021-11-01 DIAGNOSIS — Z9181 History of falling: Secondary | ICD-10-CM | POA: Diagnosis not present

## 2021-11-01 DIAGNOSIS — Z981 Arthrodesis status: Secondary | ICD-10-CM | POA: Diagnosis not present

## 2021-11-01 DIAGNOSIS — I10 Essential (primary) hypertension: Secondary | ICD-10-CM | POA: Diagnosis not present

## 2021-11-01 DIAGNOSIS — Z4789 Encounter for other orthopedic aftercare: Secondary | ICD-10-CM | POA: Diagnosis not present

## 2021-11-03 DIAGNOSIS — B372 Candidiasis of skin and nail: Secondary | ICD-10-CM | POA: Diagnosis not present

## 2021-11-03 DIAGNOSIS — Z299 Encounter for prophylactic measures, unspecified: Secondary | ICD-10-CM | POA: Diagnosis not present

## 2021-11-03 DIAGNOSIS — K120 Recurrent oral aphthae: Secondary | ICD-10-CM | POA: Diagnosis not present

## 2021-11-08 DIAGNOSIS — Z20822 Contact with and (suspected) exposure to covid-19: Secondary | ICD-10-CM | POA: Diagnosis not present

## 2021-11-10 DIAGNOSIS — B372 Candidiasis of skin and nail: Secondary | ICD-10-CM | POA: Diagnosis not present

## 2021-11-10 DIAGNOSIS — Z299 Encounter for prophylactic measures, unspecified: Secondary | ICD-10-CM | POA: Diagnosis not present

## 2021-11-10 DIAGNOSIS — Z20822 Contact with and (suspected) exposure to covid-19: Secondary | ICD-10-CM | POA: Diagnosis not present

## 2021-11-10 DIAGNOSIS — R5383 Other fatigue: Secondary | ICD-10-CM | POA: Diagnosis not present

## 2021-11-10 DIAGNOSIS — Z713 Dietary counseling and surveillance: Secondary | ICD-10-CM | POA: Diagnosis not present

## 2021-11-16 DIAGNOSIS — Z20822 Contact with and (suspected) exposure to covid-19: Secondary | ICD-10-CM | POA: Diagnosis not present

## 2021-11-27 DIAGNOSIS — M5416 Radiculopathy, lumbar region: Secondary | ICD-10-CM | POA: Diagnosis not present

## 2021-12-02 DIAGNOSIS — Z20822 Contact with and (suspected) exposure to covid-19: Secondary | ICD-10-CM | POA: Diagnosis not present

## 2021-12-09 DIAGNOSIS — Z20822 Contact with and (suspected) exposure to covid-19: Secondary | ICD-10-CM | POA: Diagnosis not present

## 2021-12-22 DIAGNOSIS — Z20822 Contact with and (suspected) exposure to covid-19: Secondary | ICD-10-CM | POA: Diagnosis not present

## 2021-12-25 DIAGNOSIS — Z20822 Contact with and (suspected) exposure to covid-19: Secondary | ICD-10-CM | POA: Diagnosis not present

## 2021-12-27 DIAGNOSIS — Z6830 Body mass index (BMI) 30.0-30.9, adult: Secondary | ICD-10-CM | POA: Diagnosis not present

## 2021-12-27 DIAGNOSIS — Z299 Encounter for prophylactic measures, unspecified: Secondary | ICD-10-CM | POA: Diagnosis not present

## 2021-12-27 DIAGNOSIS — B360 Pityriasis versicolor: Secondary | ICD-10-CM | POA: Diagnosis not present

## 2021-12-27 DIAGNOSIS — Z713 Dietary counseling and surveillance: Secondary | ICD-10-CM | POA: Diagnosis not present

## 2021-12-27 DIAGNOSIS — Z789 Other specified health status: Secondary | ICD-10-CM | POA: Diagnosis not present

## 2021-12-27 DIAGNOSIS — K120 Recurrent oral aphthae: Secondary | ICD-10-CM | POA: Diagnosis not present

## 2021-12-28 DIAGNOSIS — Z20822 Contact with and (suspected) exposure to covid-19: Secondary | ICD-10-CM | POA: Diagnosis not present

## 2022-01-03 DIAGNOSIS — C61 Malignant neoplasm of prostate: Secondary | ICD-10-CM | POA: Diagnosis not present

## 2022-01-03 DIAGNOSIS — N5201 Erectile dysfunction due to arterial insufficiency: Secondary | ICD-10-CM | POA: Diagnosis not present

## 2022-01-04 DIAGNOSIS — Z6831 Body mass index (BMI) 31.0-31.9, adult: Secondary | ICD-10-CM | POA: Diagnosis not present

## 2022-01-04 DIAGNOSIS — Z299 Encounter for prophylactic measures, unspecified: Secondary | ICD-10-CM | POA: Diagnosis not present

## 2022-01-04 DIAGNOSIS — Z713 Dietary counseling and surveillance: Secondary | ICD-10-CM | POA: Diagnosis not present

## 2022-01-04 DIAGNOSIS — B370 Candidal stomatitis: Secondary | ICD-10-CM | POA: Diagnosis not present

## 2022-01-08 DIAGNOSIS — Z20822 Contact with and (suspected) exposure to covid-19: Secondary | ICD-10-CM | POA: Diagnosis not present

## 2022-01-17 DIAGNOSIS — B370 Candidal stomatitis: Secondary | ICD-10-CM | POA: Diagnosis not present

## 2022-01-17 DIAGNOSIS — Z299 Encounter for prophylactic measures, unspecified: Secondary | ICD-10-CM | POA: Diagnosis not present

## 2022-01-17 DIAGNOSIS — K120 Recurrent oral aphthae: Secondary | ICD-10-CM | POA: Diagnosis not present

## 2022-03-09 DIAGNOSIS — N201 Calculus of ureter: Secondary | ICD-10-CM | POA: Diagnosis not present

## 2022-03-09 DIAGNOSIS — K8020 Calculus of gallbladder without cholecystitis without obstruction: Secondary | ICD-10-CM | POA: Diagnosis not present

## 2022-03-09 DIAGNOSIS — R310 Gross hematuria: Secondary | ICD-10-CM | POA: Diagnosis not present

## 2022-03-09 DIAGNOSIS — N134 Hydroureter: Secondary | ICD-10-CM | POA: Diagnosis not present

## 2022-03-09 DIAGNOSIS — N200 Calculus of kidney: Secondary | ICD-10-CM | POA: Diagnosis not present

## 2022-03-29 DIAGNOSIS — R5383 Other fatigue: Secondary | ICD-10-CM | POA: Diagnosis not present

## 2022-03-29 DIAGNOSIS — Z1339 Encounter for screening examination for other mental health and behavioral disorders: Secondary | ICD-10-CM | POA: Diagnosis not present

## 2022-03-29 DIAGNOSIS — Z1331 Encounter for screening for depression: Secondary | ICD-10-CM | POA: Diagnosis not present

## 2022-03-29 DIAGNOSIS — Z79899 Other long term (current) drug therapy: Secondary | ICD-10-CM | POA: Diagnosis not present

## 2022-03-29 DIAGNOSIS — Z299 Encounter for prophylactic measures, unspecified: Secondary | ICD-10-CM | POA: Diagnosis not present

## 2022-03-29 DIAGNOSIS — E7800 Pure hypercholesterolemia, unspecified: Secondary | ICD-10-CM | POA: Diagnosis not present

## 2022-03-29 DIAGNOSIS — Z6830 Body mass index (BMI) 30.0-30.9, adult: Secondary | ICD-10-CM | POA: Diagnosis not present

## 2022-03-29 DIAGNOSIS — Z7189 Other specified counseling: Secondary | ICD-10-CM | POA: Diagnosis not present

## 2022-03-29 DIAGNOSIS — Z0000 Encounter for general adult medical examination without abnormal findings: Secondary | ICD-10-CM | POA: Diagnosis not present

## 2022-04-02 DIAGNOSIS — Z6831 Body mass index (BMI) 31.0-31.9, adult: Secondary | ICD-10-CM | POA: Diagnosis not present

## 2022-04-02 DIAGNOSIS — M5416 Radiculopathy, lumbar region: Secondary | ICD-10-CM | POA: Diagnosis not present

## 2022-05-29 DIAGNOSIS — H43813 Vitreous degeneration, bilateral: Secondary | ICD-10-CM | POA: Diagnosis not present

## 2022-05-29 DIAGNOSIS — H353132 Nonexudative age-related macular degeneration, bilateral, intermediate dry stage: Secondary | ICD-10-CM | POA: Diagnosis not present

## 2022-05-29 DIAGNOSIS — H25813 Combined forms of age-related cataract, bilateral: Secondary | ICD-10-CM | POA: Diagnosis not present

## 2022-05-29 DIAGNOSIS — H35361 Drusen (degenerative) of macula, right eye: Secondary | ICD-10-CM | POA: Diagnosis not present

## 2022-06-09 DIAGNOSIS — Z23 Encounter for immunization: Secondary | ICD-10-CM | POA: Diagnosis not present

## 2022-08-03 DIAGNOSIS — R350 Frequency of micturition: Secondary | ICD-10-CM | POA: Diagnosis not present

## 2022-08-03 DIAGNOSIS — R5383 Other fatigue: Secondary | ICD-10-CM | POA: Diagnosis not present

## 2022-08-03 DIAGNOSIS — Z299 Encounter for prophylactic measures, unspecified: Secondary | ICD-10-CM | POA: Diagnosis not present

## 2022-08-07 ENCOUNTER — Other Ambulatory Visit: Payer: Self-pay | Admitting: Neurology

## 2022-10-01 DIAGNOSIS — M5126 Other intervertebral disc displacement, lumbar region: Secondary | ICD-10-CM | POA: Diagnosis not present

## 2022-10-01 DIAGNOSIS — M48061 Spinal stenosis, lumbar region without neurogenic claudication: Secondary | ICD-10-CM | POA: Diagnosis not present

## 2022-10-02 DIAGNOSIS — G250 Essential tremor: Secondary | ICD-10-CM | POA: Diagnosis not present

## 2022-10-02 DIAGNOSIS — D692 Other nonthrombocytopenic purpura: Secondary | ICD-10-CM | POA: Diagnosis not present

## 2022-10-02 DIAGNOSIS — I10 Essential (primary) hypertension: Secondary | ICD-10-CM | POA: Diagnosis not present

## 2022-10-02 DIAGNOSIS — E7800 Pure hypercholesterolemia, unspecified: Secondary | ICD-10-CM | POA: Diagnosis not present

## 2022-10-02 DIAGNOSIS — Z299 Encounter for prophylactic measures, unspecified: Secondary | ICD-10-CM | POA: Diagnosis not present

## 2022-10-16 DIAGNOSIS — I10 Essential (primary) hypertension: Secondary | ICD-10-CM | POA: Diagnosis not present

## 2022-10-16 DIAGNOSIS — R42 Dizziness and giddiness: Secondary | ICD-10-CM | POA: Diagnosis not present

## 2022-10-16 DIAGNOSIS — Z299 Encounter for prophylactic measures, unspecified: Secondary | ICD-10-CM | POA: Diagnosis not present

## 2022-11-12 DIAGNOSIS — Z299 Encounter for prophylactic measures, unspecified: Secondary | ICD-10-CM | POA: Diagnosis not present

## 2022-11-12 DIAGNOSIS — Z0000 Encounter for general adult medical examination without abnormal findings: Secondary | ICD-10-CM | POA: Diagnosis not present

## 2022-11-12 DIAGNOSIS — I10 Essential (primary) hypertension: Secondary | ICD-10-CM | POA: Diagnosis not present

## 2022-11-12 DIAGNOSIS — Z7189 Other specified counseling: Secondary | ICD-10-CM | POA: Diagnosis not present

## 2022-12-05 IMAGING — RF DG LUMBAR SPINE 2-3V
1 series · 2 of 2 positions shown · non-contrast
Comparison: MRI 03/08/2021, radiograph 02/14/2021

CLINICAL DATA: Spine surgery

EXAM:
LUMBAR SPINE - 2-3 VIEW

[Series 1: run · 2 of 2 slices shown]
[im 1/2]
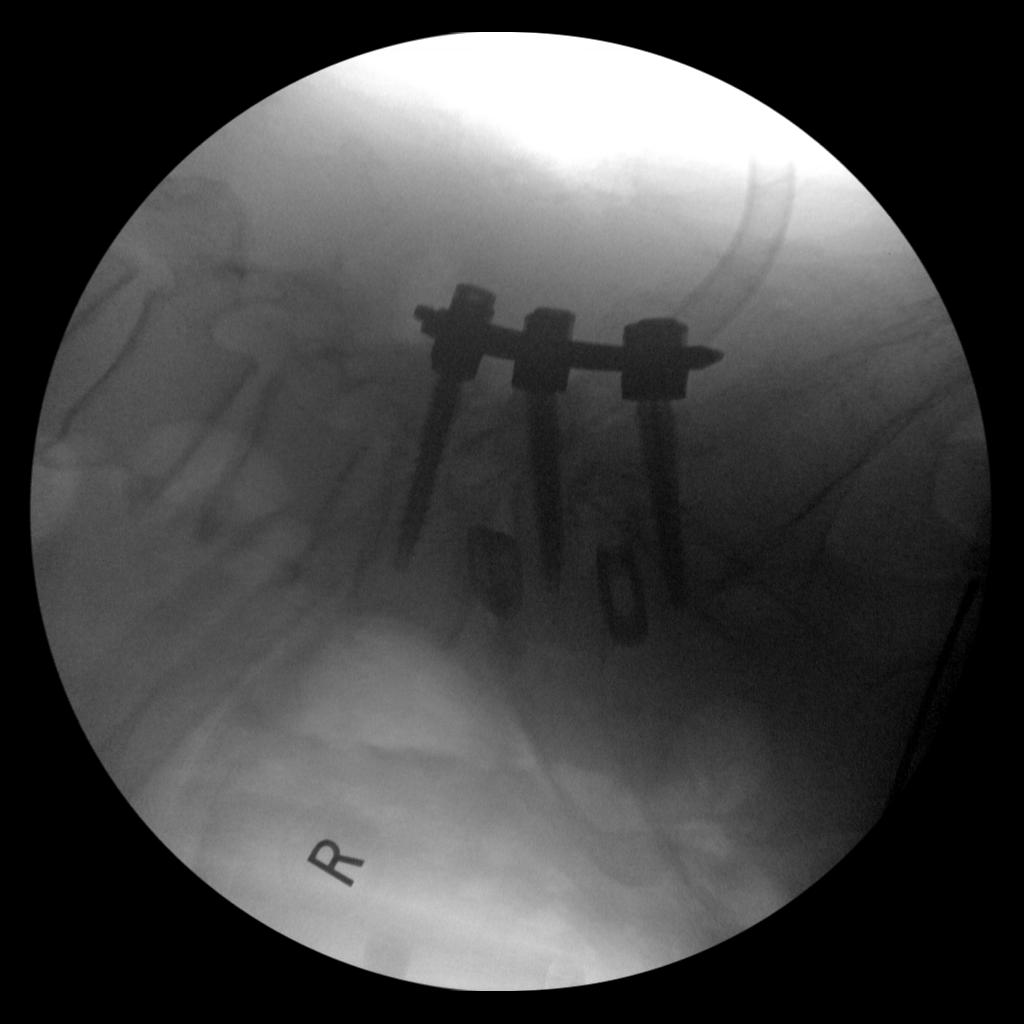
[im 2/2]
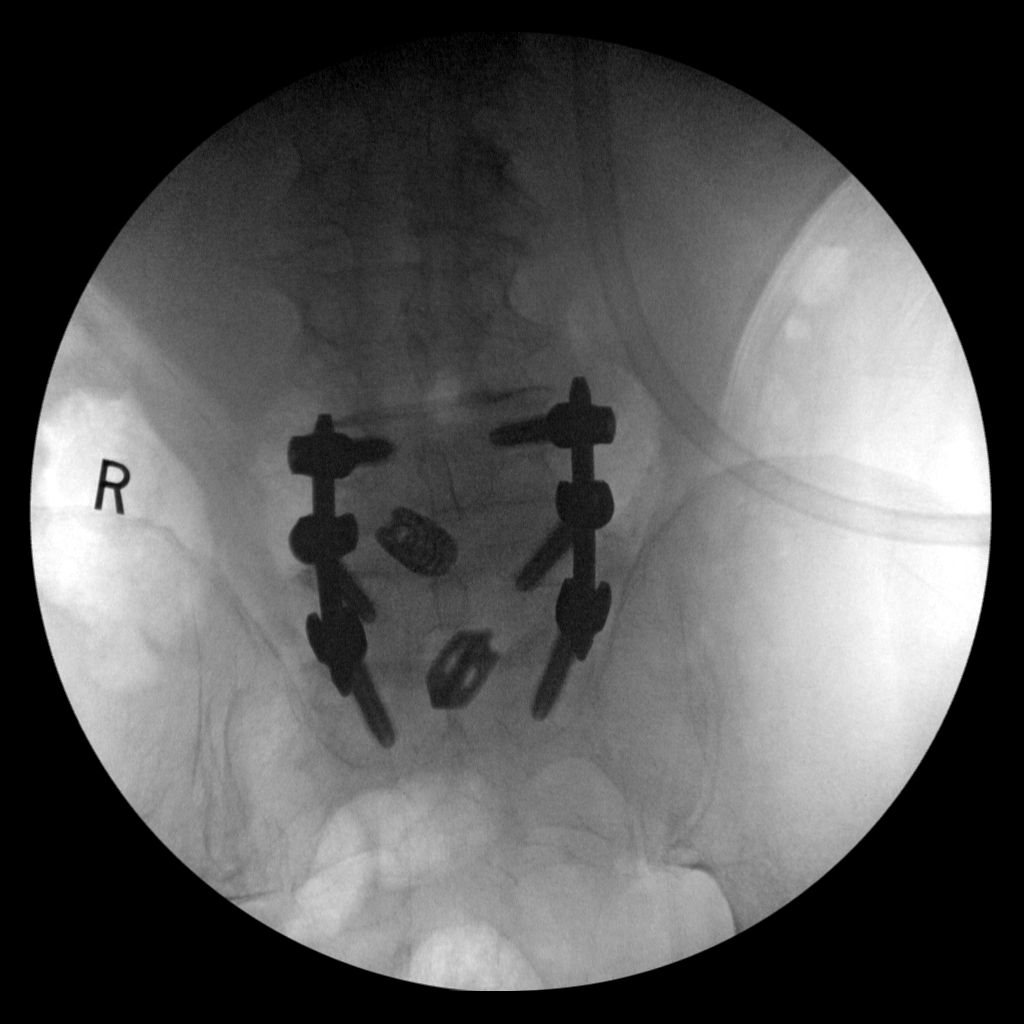

[2 of 2 positions shown; findings below may reference images not displayed]

FINDINGS: Two low resolution intraoperative spot views of the lumbar spine.
Total fluoroscopy time was 17.5 seconds, fluoroscopy dose of
mGy. The images demonstrate posterior fusion hardware L4 through S1
with posterior rods, fixating screws and interbody devices.
IMPRESSION: Intraoperative fluoroscopic assistance provided during lumbar spine
surgery

## 2022-12-27 ENCOUNTER — Other Ambulatory Visit: Payer: Self-pay | Admitting: Neurology

## 2023-01-02 DIAGNOSIS — R311 Benign essential microscopic hematuria: Secondary | ICD-10-CM | POA: Diagnosis not present

## 2023-04-01 DIAGNOSIS — M48062 Spinal stenosis, lumbar region with neurogenic claudication: Secondary | ICD-10-CM | POA: Diagnosis not present

## 2023-04-09 DIAGNOSIS — I10 Essential (primary) hypertension: Secondary | ICD-10-CM | POA: Diagnosis not present

## 2023-04-09 DIAGNOSIS — Z79899 Other long term (current) drug therapy: Secondary | ICD-10-CM | POA: Diagnosis not present

## 2023-04-09 DIAGNOSIS — E7800 Pure hypercholesterolemia, unspecified: Secondary | ICD-10-CM | POA: Diagnosis not present

## 2023-04-09 DIAGNOSIS — Z299 Encounter for prophylactic measures, unspecified: Secondary | ICD-10-CM | POA: Diagnosis not present

## 2023-04-09 DIAGNOSIS — R5383 Other fatigue: Secondary | ICD-10-CM | POA: Diagnosis not present

## 2023-04-09 DIAGNOSIS — Z0000 Encounter for general adult medical examination without abnormal findings: Secondary | ICD-10-CM | POA: Diagnosis not present

## 2023-04-10 DIAGNOSIS — M48062 Spinal stenosis, lumbar region with neurogenic claudication: Secondary | ICD-10-CM | POA: Diagnosis not present

## 2023-04-10 DIAGNOSIS — M5126 Other intervertebral disc displacement, lumbar region: Secondary | ICD-10-CM | POA: Diagnosis not present

## 2023-04-10 DIAGNOSIS — N1330 Unspecified hydronephrosis: Secondary | ICD-10-CM | POA: Diagnosis not present

## 2023-04-10 DIAGNOSIS — M48061 Spinal stenosis, lumbar region without neurogenic claudication: Secondary | ICD-10-CM | POA: Diagnosis not present

## 2023-04-10 DIAGNOSIS — M5136 Other intervertebral disc degeneration, lumbar region: Secondary | ICD-10-CM | POA: Diagnosis not present

## 2023-05-06 DIAGNOSIS — M48062 Spinal stenosis, lumbar region with neurogenic claudication: Secondary | ICD-10-CM | POA: Diagnosis not present

## 2023-05-06 DIAGNOSIS — M47816 Spondylosis without myelopathy or radiculopathy, lumbar region: Secondary | ICD-10-CM | POA: Diagnosis not present

## 2023-05-28 DIAGNOSIS — M48062 Spinal stenosis, lumbar region with neurogenic claudication: Secondary | ICD-10-CM | POA: Diagnosis not present

## 2023-06-13 DIAGNOSIS — Z23 Encounter for immunization: Secondary | ICD-10-CM | POA: Diagnosis not present

## 2023-06-13 DIAGNOSIS — R42 Dizziness and giddiness: Secondary | ICD-10-CM | POA: Diagnosis not present

## 2023-06-13 DIAGNOSIS — I10 Essential (primary) hypertension: Secondary | ICD-10-CM | POA: Diagnosis not present

## 2023-06-13 DIAGNOSIS — Z299 Encounter for prophylactic measures, unspecified: Secondary | ICD-10-CM | POA: Diagnosis not present

## 2023-06-13 DIAGNOSIS — D692 Other nonthrombocytopenic purpura: Secondary | ICD-10-CM | POA: Diagnosis not present

## 2023-07-03 DIAGNOSIS — M47816 Spondylosis without myelopathy or radiculopathy, lumbar region: Secondary | ICD-10-CM | POA: Diagnosis not present

## 2023-07-03 DIAGNOSIS — M48062 Spinal stenosis, lumbar region with neurogenic claudication: Secondary | ICD-10-CM | POA: Diagnosis not present

## 2023-08-28 DIAGNOSIS — M48062 Spinal stenosis, lumbar region with neurogenic claudication: Secondary | ICD-10-CM | POA: Diagnosis not present

## 2023-08-29 DIAGNOSIS — R300 Dysuria: Secondary | ICD-10-CM | POA: Diagnosis not present

## 2023-08-29 DIAGNOSIS — I10 Essential (primary) hypertension: Secondary | ICD-10-CM | POA: Diagnosis not present

## 2023-08-29 DIAGNOSIS — R42 Dizziness and giddiness: Secondary | ICD-10-CM | POA: Diagnosis not present

## 2023-08-29 DIAGNOSIS — Z87442 Personal history of urinary calculi: Secondary | ICD-10-CM | POA: Diagnosis not present

## 2023-08-29 DIAGNOSIS — Z299 Encounter for prophylactic measures, unspecified: Secondary | ICD-10-CM | POA: Diagnosis not present

## 2023-09-12 DIAGNOSIS — H353132 Nonexudative age-related macular degeneration, bilateral, intermediate dry stage: Secondary | ICD-10-CM | POA: Diagnosis not present

## 2023-09-12 DIAGNOSIS — H35361 Drusen (degenerative) of macula, right eye: Secondary | ICD-10-CM | POA: Diagnosis not present

## 2023-09-12 DIAGNOSIS — H524 Presbyopia: Secondary | ICD-10-CM | POA: Diagnosis not present

## 2023-09-12 DIAGNOSIS — H25813 Combined forms of age-related cataract, bilateral: Secondary | ICD-10-CM | POA: Diagnosis not present

## 2023-10-21 DIAGNOSIS — M48062 Spinal stenosis, lumbar region with neurogenic claudication: Secondary | ICD-10-CM | POA: Diagnosis not present

## 2023-11-21 DIAGNOSIS — E7800 Pure hypercholesterolemia, unspecified: Secondary | ICD-10-CM | POA: Diagnosis not present

## 2023-11-21 DIAGNOSIS — I10 Essential (primary) hypertension: Secondary | ICD-10-CM | POA: Diagnosis not present

## 2023-11-21 DIAGNOSIS — Z7189 Other specified counseling: Secondary | ICD-10-CM | POA: Diagnosis not present

## 2023-11-21 DIAGNOSIS — Z713 Dietary counseling and surveillance: Secondary | ICD-10-CM | POA: Diagnosis not present

## 2023-11-21 DIAGNOSIS — Z0000 Encounter for general adult medical examination without abnormal findings: Secondary | ICD-10-CM | POA: Diagnosis not present

## 2023-11-21 DIAGNOSIS — Z299 Encounter for prophylactic measures, unspecified: Secondary | ICD-10-CM | POA: Diagnosis not present

## 2023-11-27 DIAGNOSIS — M48062 Spinal stenosis, lumbar region with neurogenic claudication: Secondary | ICD-10-CM | POA: Diagnosis not present

## 2024-01-03 DIAGNOSIS — N200 Calculus of kidney: Secondary | ICD-10-CM | POA: Diagnosis not present

## 2024-01-20 DIAGNOSIS — M48062 Spinal stenosis, lumbar region with neurogenic claudication: Secondary | ICD-10-CM | POA: Diagnosis not present

## 2024-02-04 DIAGNOSIS — N200 Calculus of kidney: Secondary | ICD-10-CM | POA: Diagnosis not present

## 2024-02-05 DIAGNOSIS — N200 Calculus of kidney: Secondary | ICD-10-CM | POA: Diagnosis not present

## 2024-03-20 DIAGNOSIS — Z299 Encounter for prophylactic measures, unspecified: Secondary | ICD-10-CM | POA: Diagnosis not present

## 2024-03-20 DIAGNOSIS — M25561 Pain in right knee: Secondary | ICD-10-CM | POA: Diagnosis not present

## 2024-03-20 DIAGNOSIS — M791 Myalgia, unspecified site: Secondary | ICD-10-CM | POA: Diagnosis not present

## 2024-03-20 DIAGNOSIS — I10 Essential (primary) hypertension: Secondary | ICD-10-CM | POA: Diagnosis not present

## 2024-03-30 DIAGNOSIS — I10 Essential (primary) hypertension: Secondary | ICD-10-CM | POA: Diagnosis not present

## 2024-03-30 DIAGNOSIS — R52 Pain, unspecified: Secondary | ICD-10-CM | POA: Diagnosis not present

## 2024-03-30 DIAGNOSIS — Z299 Encounter for prophylactic measures, unspecified: Secondary | ICD-10-CM | POA: Diagnosis not present

## 2024-03-30 DIAGNOSIS — M25561 Pain in right knee: Secondary | ICD-10-CM | POA: Diagnosis not present

## 2024-04-15 DIAGNOSIS — R42 Dizziness and giddiness: Secondary | ICD-10-CM | POA: Diagnosis not present

## 2024-04-15 DIAGNOSIS — I10 Essential (primary) hypertension: Secondary | ICD-10-CM | POA: Diagnosis not present

## 2024-04-15 DIAGNOSIS — R52 Pain, unspecified: Secondary | ICD-10-CM | POA: Diagnosis not present

## 2024-04-15 DIAGNOSIS — E7800 Pure hypercholesterolemia, unspecified: Secondary | ICD-10-CM | POA: Diagnosis not present

## 2024-04-15 DIAGNOSIS — M159 Polyosteoarthritis, unspecified: Secondary | ICD-10-CM | POA: Diagnosis not present

## 2024-04-15 DIAGNOSIS — Z299 Encounter for prophylactic measures, unspecified: Secondary | ICD-10-CM | POA: Diagnosis not present

## 2024-04-15 DIAGNOSIS — R5383 Other fatigue: Secondary | ICD-10-CM | POA: Diagnosis not present

## 2024-04-15 DIAGNOSIS — Z79899 Other long term (current) drug therapy: Secondary | ICD-10-CM | POA: Diagnosis not present

## 2024-04-15 DIAGNOSIS — Z0000 Encounter for general adult medical examination without abnormal findings: Secondary | ICD-10-CM | POA: Diagnosis not present
